# Patient Record
Sex: Female | Born: 2000 | Race: Asian | Hispanic: No | Marital: Single | Smoking: Never smoker
Health system: Southern US, Community
[De-identification: ages and names within clinical notes are randomized; demographics above are authoritative.]

## PROBLEM LIST (undated history)

## (undated) ENCOUNTER — Inpatient Hospital Stay (HOSPITAL_COMMUNITY): Payer: Self-pay

## (undated) DIAGNOSIS — R12 Heartburn: Secondary | ICD-10-CM

## (undated) DIAGNOSIS — N926 Irregular menstruation, unspecified: Secondary | ICD-10-CM

## (undated) DIAGNOSIS — E538 Deficiency of other specified B group vitamins: Secondary | ICD-10-CM

## (undated) DIAGNOSIS — N939 Abnormal uterine and vaginal bleeding, unspecified: Secondary | ICD-10-CM

## (undated) DIAGNOSIS — M549 Dorsalgia, unspecified: Secondary | ICD-10-CM

## (undated) DIAGNOSIS — F902 Attention-deficit hyperactivity disorder, combined type: Secondary | ICD-10-CM

## (undated) DIAGNOSIS — F909 Attention-deficit hyperactivity disorder, unspecified type: Secondary | ICD-10-CM

## (undated) DIAGNOSIS — F419 Anxiety disorder, unspecified: Secondary | ICD-10-CM

## (undated) DIAGNOSIS — E559 Vitamin D deficiency, unspecified: Secondary | ICD-10-CM

## (undated) DIAGNOSIS — Z8711 Personal history of peptic ulcer disease: Secondary | ICD-10-CM

## (undated) DIAGNOSIS — K5900 Constipation, unspecified: Secondary | ICD-10-CM

## (undated) DIAGNOSIS — F32A Depression, unspecified: Secondary | ICD-10-CM

## (undated) DIAGNOSIS — R278 Other lack of coordination: Secondary | ICD-10-CM

## (undated) DIAGNOSIS — R0602 Shortness of breath: Secondary | ICD-10-CM

## (undated) HISTORY — DX: Attention-deficit hyperactivity disorder, combined type: F90.2

## (undated) HISTORY — DX: Irregular menstruation, unspecified: N92.6

## (undated) HISTORY — DX: Other lack of coordination: R27.8

## (undated) HISTORY — DX: Attention-deficit hyperactivity disorder, unspecified type: F90.9

## (undated) HISTORY — DX: Depression, unspecified: F32.A

## (undated) HISTORY — DX: Constipation, unspecified: K59.00

## (undated) HISTORY — DX: Vitamin D deficiency, unspecified: E55.9

## (undated) HISTORY — DX: Dorsalgia, unspecified: M54.9

## (undated) HISTORY — DX: Deficiency of other specified B group vitamins: E53.8

## (undated) HISTORY — DX: Shortness of breath: R06.02

## (undated) HISTORY — DX: Heartburn: R12

## (undated) HISTORY — DX: Abnormal uterine and vaginal bleeding, unspecified: N93.9

## (undated) HISTORY — DX: Anxiety disorder, unspecified: F41.9

## (undated) HISTORY — DX: Personal history of peptic ulcer disease: Z87.11

---
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
## 2019-01-10 DIAGNOSIS — E611 Iron deficiency: Secondary | ICD-10-CM | POA: Diagnosis not present

## 2019-01-10 DIAGNOSIS — R635 Abnormal weight gain: Secondary | ICD-10-CM | POA: Diagnosis not present

## 2019-01-10 DIAGNOSIS — N951 Menopausal and female climacteric states: Secondary | ICD-10-CM | POA: Diagnosis not present

## 2019-01-10 DIAGNOSIS — E782 Mixed hyperlipidemia: Secondary | ICD-10-CM | POA: Diagnosis not present

## 2019-01-10 DIAGNOSIS — R7309 Other abnormal glucose: Secondary | ICD-10-CM | POA: Diagnosis not present

## 2019-01-10 DIAGNOSIS — E559 Vitamin D deficiency, unspecified: Secondary | ICD-10-CM | POA: Diagnosis not present

## 2019-01-12 DIAGNOSIS — Z1339 Encounter for screening examination for other mental health and behavioral disorders: Secondary | ICD-10-CM | POA: Diagnosis not present

## 2019-01-12 DIAGNOSIS — Z6841 Body Mass Index (BMI) 40.0 and over, adult: Secondary | ICD-10-CM | POA: Diagnosis not present

## 2019-01-12 DIAGNOSIS — Z62810 Personal history of physical and sexual abuse in childhood: Secondary | ICD-10-CM | POA: Diagnosis not present

## 2019-01-12 DIAGNOSIS — Z1331 Encounter for screening for depression: Secondary | ICD-10-CM | POA: Diagnosis not present

## 2019-01-12 DIAGNOSIS — E782 Mixed hyperlipidemia: Secondary | ICD-10-CM | POA: Diagnosis not present

## 2019-01-12 DIAGNOSIS — E611 Iron deficiency: Secondary | ICD-10-CM | POA: Diagnosis not present

## 2019-01-28 DIAGNOSIS — A09 Infectious gastroenteritis and colitis, unspecified: Secondary | ICD-10-CM | POA: Diagnosis not present

## 2019-02-05 ENCOUNTER — Encounter (HOSPITAL_COMMUNITY): Payer: Self-pay

## 2019-02-05 ENCOUNTER — Other Ambulatory Visit: Payer: Self-pay

## 2019-02-05 ENCOUNTER — Emergency Department (HOSPITAL_COMMUNITY)
Admission: EM | Admit: 2019-02-05 | Discharge: 2019-02-05 | Disposition: A | Discharge status: Home / Self Care | Payer: BLUE CROSS/BLUE SHIELD | Attending: Emergency Medicine | Admitting: Emergency Medicine

## 2019-02-05 DIAGNOSIS — R112 Nausea with vomiting, unspecified: Secondary | ICD-10-CM | POA: Diagnosis not present

## 2019-02-05 DIAGNOSIS — R1084 Generalized abdominal pain: Secondary | ICD-10-CM | POA: Insufficient documentation

## 2019-02-05 DIAGNOSIS — R197 Diarrhea, unspecified: Secondary | ICD-10-CM | POA: Insufficient documentation

## 2019-02-05 DIAGNOSIS — Z79899 Other long term (current) drug therapy: Secondary | ICD-10-CM | POA: Insufficient documentation

## 2019-02-05 LAB — URINALYSIS, ROUTINE W REFLEX MICROSCOPIC
Bilirubin Urine: NEGATIVE
Glucose, UA: NEGATIVE mg/dL
Ketones, ur: NEGATIVE mg/dL
Nitrite: NEGATIVE
Protein, ur: NEGATIVE mg/dL
Specific Gravity, Urine: 1.029 (ref 1.005–1.030)
pH: 5 (ref 5.0–8.0)

## 2019-02-05 LAB — CBC
HCT: 42.7 % (ref 36.0–46.0)
Hemoglobin: 13.1 g/dL (ref 12.0–15.0)
MCH: 26.5 pg (ref 26.0–34.0)
MCHC: 30.7 g/dL (ref 30.0–36.0)
MCV: 86.4 fL (ref 80.0–100.0)
Platelets: 435 10*3/uL — ABNORMAL HIGH (ref 150–400)
RBC: 4.94 MIL/uL (ref 3.87–5.11)
RDW: 14.3 % (ref 11.5–15.5)
WBC: 13.8 10*3/uL — ABNORMAL HIGH (ref 4.0–10.5)
nRBC: 0 % (ref 0.0–0.2)

## 2019-02-05 LAB — COMPREHENSIVE METABOLIC PANEL
ALT: 15 U/L (ref 0–44)
AST: 15 U/L (ref 15–41)
Albumin: 3.5 g/dL (ref 3.5–5.0)
Alkaline Phosphatase: 114 U/L (ref 38–126)
Anion gap: 7 (ref 5–15)
BUN: 13 mg/dL (ref 6–20)
CO2: 23 mmol/L (ref 22–32)
Calcium: 8.6 mg/dL — ABNORMAL LOW (ref 8.9–10.3)
Chloride: 106 mmol/L (ref 98–111)
Creatinine, Ser: 0.66 mg/dL (ref 0.44–1.00)
GFR calc Af Amer: >60 mL/min (ref >60)
GFR calc non Af Amer: >60 mL/min (ref >60)
Glucose, Bld: 119 mg/dL — ABNORMAL HIGH (ref 70–99)
Potassium: 3.6 mmol/L (ref 3.5–5.1)
Sodium: 136 mmol/L (ref 135–145)
Total Bilirubin: 0.4 mg/dL (ref 0.3–1.2)
Total Protein: 7.3 g/dL (ref 6.5–8.1)

## 2019-02-05 LAB — LIPASE, BLOOD: Lipase: 20 U/L (ref 11–51)

## 2019-02-05 LAB — I-STAT BETA HCG BLOOD, ED (MC, WL, AP ONLY): I-stat hCG, quantitative: <5 m[IU]/mL (ref <5)

## 2019-02-05 MED ORDER — ONDANSETRON 4 MG PO TBDP: 4.0000 mg | ORAL_TABLET | Freq: Three times a day (TID) | ORAL | 0 refills | Status: DC | PRN

## 2019-02-05 NOTE — ED Provider Notes (Signed)
[facility] DEPT Provider Note   CSN: [id number] Arrival date & time: 02/05/19  0350     History   Chief Complaint Chief Complaint  Patient presents with  . Abdominal Pain    HPI Jean Stone is a 18 y.o. female without significant past medical history, presenting to the emergency department with complaint of nausea, vomiting, and diarrhea began Friday morning upon awakening.  She states she also had some generalized lower abdominal cramping, however this has mostly resolved.  She states her nausea, vomiting, and diarrhea are also improving.  She denies nausea on evaluation.  She denies fever or urinary symptoms.  No pelvic complaints.  Medications taken for symptoms.  No known sick contacts.     The history is provided by the patient.    Past Medical History:  Diagnosis Date  . Abnormal bleeding in menstrual cycle   . ADHD (attention deficit hyperactivity disorder)   . 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 Pain in right wrist 02/05/2018  . 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 No pertinent surgical history.   OB History   No obstetric history on file.      Home Medications    Prior to Admission medications   Medication Sig Start Date End Date Taking? Authorizing Provider  acetaminophen (TYLENOL) 500 MG tablet Take 1,000 mg by mouth every 6 (six) hours as needed for moderate pain.   Yes [provider]  levonorgestrel-ethinyl estradiol (SEASONALE) 0.15-0.03 MG tablet Take 1 tablet by mouth daily.   Yes [provider]  ibuprofen (ADVIL,MOTRIN) 800 MG tablet Take 1 tablet (800 mg total) by mouth every 8 (eight) hours as needed for moderate pain. Patient not  taking: Reported on 02/05/2019 02/04/18   Ezequiel Essex, MD  ondansetron (ZOFRAN ODT) 4 MG disintegrating tablet Take 1 tablet (4 mg total) by mouth every 8 (eight) hours as needed for nausea or vomiting. 02/05/19   Robinson, [place] N, PA-C    Family History Family History  Problem Relation Age of Onset  . ADD / ADHD Sister   . Diabetes Maternal Grandmother   . Hypertension Maternal Grandfather     Social History Social History   Tobacco Use  . Smoking status: Never Smoker  . Smokeless tobacco: Never Used  Substance Use Topics  . Alcohol use: No    Alcohol/week: 0.0 standard drinks  . Drug use: No     Allergies   Patient has no known allergies.   Review of Systems Review of Systems  Gastrointestinal: Positive for diarrhea, nausea and vomiting.  All other systems reviewed and are negative.    Physical Exam Updated Vital Signs BP (!) 141/94   Pulse 97   Temp 98.8 F (37.1 C) (Oral)   Resp 16   SpO2 97%   Physical Exam Vitals signs and nursing note reviewed.  Constitutional:      General: She is not in acute distress.    Appearance: She is well-developed. She is obese. She is not ill-appearing.  HENT:     Head: Normocephalic and atraumatic.     Mouth/Throat:     Mouth: Mucous membranes are moist.  Eyes:     Conjunctiva/sclera: Conjunctivae normal.  Cardiovascular:     Rate and Rhythm: Normal rate  and regular rhythm.  Pulmonary:     Effort: Pulmonary effort is normal. No respiratory distress.     Breath sounds: Normal breath sounds.  Abdominal:     General: There is no distension.     Palpations: Abdomen is soft.     Tenderness: There is no abdominal tenderness. There is no guarding or rebound. Negative signs include Rovsing's sign and McBurney's sign.  Skin:    General: Skin is warm.  Neurological:     Mental Status: She is alert.  Psychiatric:        Behavior: Behavior normal.      ED Treatments / Results  Labs (all labs ordered are listed, but  only abnormal results are displayed) Labs Reviewed  COMPREHENSIVE METABOLIC PANEL - Abnormal; Notable for the following components:      Result Value   Glucose, Bld 119 (*)    Calcium 8.6 (*)    All other components within normal limits  CBC - Abnormal; Notable for the following components:   WBC 13.8 (*)    Platelets 435 (*)    All other components within normal limits  URINALYSIS, ROUTINE W REFLEX MICROSCOPIC - Abnormal; Notable for the following components:   APPearance HAZY (*)    Hgb urine dipstick SMALL (*)    Leukocytes,Ua TRACE (*)    Bacteria, UA RARE (*)    All other components within normal limits  URINE CULTURE  LIPASE, BLOOD  I-STAT BETA HCG BLOOD, ED ([facility], [facility], [facility] ONLY)    EKG None  Radiology No results found.  Procedures Procedures (including critical care time)  Medications Ordered in ED Medications - No data to display   Initial Impression / Assessment and Plan / ED Course  I have reviewed the triage vital signs and the nursing notes.  Pertinent labs & imaging results that were available during my care of the patient were reviewed by me and considered in my medical decision making (see chart for details).       Patient with symptoms consistent with likely viral gastroenteritis.  Vitals are stable, no fever.  No signs of dehydration, tolerating PO fluids > 6 oz.  Lungs are clear.  No focal abdominal pain, no concern for appendicitis, cholecystitis, pancreatitis, ruptured viscus, UTI, kidney stone, or any other abdominal etiology. Labs obtained in triage reveal leukocytosis, likely reactive from the vomiting. U/A with mild signs of infection however pt is asymptomatic, will send for culture.  Supportive therapy indicated with return if symptoms worsen.  Patient agreeable to plan and safe for discharge.  Discussed results, findings, treatment and follow up. Patient advised of return precautions. Patient verbalized understanding and agreed with plan.   Final  Clinical Impressions(s) / ED Diagnoses   Final diagnoses:  Nausea vomiting and diarrhea    ED Discharge Orders         Ordered    ondansetron (ZOFRAN ODT) 4 MG disintegrating tablet  Every 8 hours PRN     02/05/19 0530           Robinson, [place] N, PA-C 02/05/19 0534    Paula LibraMolpus, John, MD 02/05/19 [phone]

## 2019-02-05 NOTE — Discharge Instructions (Addendum)
Please read instructions below. °Drink clear liquids until your stomach feels better. Then, slowly introduce bland foods into your diet as tolerated, such as bread, rice, apples, bananas. °You can take zofran every 8 hours as needed for nausea. °Follow up with your primary care if symptoms persist. °Return to the ER for severe abdominal pain, fever, uncontrollable vomiting, or new or concerning symptoms. ° °

## 2019-02-05 NOTE — ED Notes (Signed)
Urine culture sent to the lab. 

## 2019-02-05 NOTE — ED Triage Notes (Signed)
Pt reports lower abdominal pain that started yesterday. She endorses nausea, vomiting, and diarrhea all day yesterday.

## 2019-02-07 LAB — URINE CULTURE: Culture: >=100000 — AB

## 2019-02-08 DIAGNOSIS — Z6841 Body Mass Index (BMI) 40.0 and over, adult: Secondary | ICD-10-CM | POA: Diagnosis not present

## 2019-02-08 DIAGNOSIS — E782 Mixed hyperlipidemia: Secondary | ICD-10-CM | POA: Diagnosis not present

## 2019-03-22 ENCOUNTER — Ambulatory Visit (INDEPENDENT_AMBULATORY_CARE_PROVIDER_SITE_OTHER): Payer: BC Managed Care – PPO | Admitting: Plastic Surgery

## 2019-03-22 ENCOUNTER — Encounter: Payer: Self-pay | Admitting: Plastic Surgery

## 2019-03-22 ENCOUNTER — Other Ambulatory Visit: Payer: Self-pay

## 2019-03-22 DIAGNOSIS — G8929 Other chronic pain: Secondary | ICD-10-CM | POA: Diagnosis not present

## 2019-03-22 DIAGNOSIS — M549 Dorsalgia, unspecified: Secondary | ICD-10-CM | POA: Insufficient documentation

## 2019-03-22 DIAGNOSIS — M542 Cervicalgia: Secondary | ICD-10-CM

## 2019-03-22 DIAGNOSIS — N62 Hypertrophy of breast: Secondary | ICD-10-CM | POA: Diagnosis not present

## 2019-03-22 DIAGNOSIS — M546 Pain in thoracic spine: Secondary | ICD-10-CM | POA: Diagnosis not present

## 2019-03-22 NOTE — Progress Notes (Signed)
Patient ID: Jean Stone, female    DOB: [birth year], 18 y.o.   MRN: [id number]   Chief Complaint  Patient presents with  . Advice Only    for (B) breast reduction    Mammary Hyperplasia: The patient is a 18 y.o. female with a history of mammary hyperplasia for several years.  She has extremely large breasts causing symptoms that include the following: Back pain (upper and lower) and neck pain. She frequently pins bra cups higher on straps for better lift and relief. Notices relief when holding breast up in her hands. Shoulder straps causing grooves, pain occasionally requiring padding. Pain medication is sometimes required with motrin and tylenol.  Activities that are hindered by enlarged breasts include: Running and exercise  Her breasts are extremely large and fairly symmetric.  She has hyperpigmentation of the inframammary area on both sides.  The sternal to nipple distance on the right is 35 cm and the left is 38 cm.  The IMF distance is 6 cm.  She is 5 feet 5 in inches tall and weighs 254 pounds.  Preoperative bra size = 40 cup.  The estimated excess breast tissue to be removed at the time of surgery = 900 grams on the left and 900 grams on the right.  Mammogram history: No history of a mammogram and no family history of breast cancer.   Review of Systems  Constitutional: Positive for activity change. Negative for appetite change.  HENT: Negative.   Eyes: Negative.   Respiratory: Negative.   Cardiovascular: Negative.  Negative for leg swelling.  Gastrointestinal: Negative for abdominal pain.  Endocrine: Negative.   Genitourinary: Negative.   Musculoskeletal: Positive for back pain and neck pain.  Skin: Positive for color change.  Hematological: Negative.   Psychiatric/Behavioral: Negative.     Past Medical History:  Diagnosis Date  . Abnormal bleeding in menstrual cycle   . ADHD (attention deficit hyperactivity disorder)   . ADHD (attention deficit hyperactivity disorder),  combined type 10/02/2015  . 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    Current Outpatient Medications:  .  levonorgestrel-ethinyl estradiol (SEASONALE) 0.15-0.03 MG tablet, Take 1 tablet by mouth daily., Disp: , Rfl:    Objective:   Vitals:   03/22/19 1526  BP: 107/76  Pulse: 96  Temp: 97.8 F (36.6 C)  SpO2: 98%    Physical Exam Constitutional:      Appearance: Normal appearance.  HENT:     Head: Normocephalic and atraumatic.  Eyes:     Extraocular Movements: Extraocular movements intact.  Neck:     Musculoskeletal: Normal range of motion.  Cardiovascular:     Rate and Rhythm: Normal rate.     Pulses: Normal pulses.  Pulmonary:     Effort: Pulmonary effort is normal.  Abdominal:     General: Abdomen is flat. There is no distension.     Tenderness: There is no abdominal tenderness.     Hernia: No hernia is present.  Musculoskeletal: Normal range of motion.  Skin:    General: Skin is warm.  Neurological:     General: No focal deficit present.     Mental Status: She is alert and oriented to person, place, and time.  Psychiatric:        Mood and Affect: Mood normal.        Behavior: Behavior normal.        Thought Content: Thought content normal.     Assessment & Plan:  Symptomatic mammary hypertrophy  Chronic bilateral thoracic back pain  Neck pain  Recommend bilateral breast reduction with liposuction. Patient will get PT and PCP information sent to us.  Pictures were obtained of the patient and placed in the chart with the patient's or guardian's permission.  Alena Billslaire S Dillingham, DO

## 2019-08-17 DIAGNOSIS — Z01419 Encounter for gynecological examination (general) (routine) without abnormal findings: Secondary | ICD-10-CM | POA: Diagnosis not present

## 2019-08-17 DIAGNOSIS — Z6841 Body Mass Index (BMI) 40.0 and over, adult: Secondary | ICD-10-CM | POA: Diagnosis not present

## 2019-08-18 DIAGNOSIS — R109 Unspecified abdominal pain: Secondary | ICD-10-CM | POA: Diagnosis not present

## 2019-08-18 DIAGNOSIS — R635 Abnormal weight gain: Secondary | ICD-10-CM | POA: Diagnosis not present

## 2019-08-18 DIAGNOSIS — R002 Palpitations: Secondary | ICD-10-CM | POA: Diagnosis not present

## 2019-08-19 ENCOUNTER — Other Ambulatory Visit: Payer: Self-pay | Admitting: Pediatrics

## 2019-08-19 ENCOUNTER — Other Ambulatory Visit (HOSPITAL_COMMUNITY): Payer: Self-pay | Admitting: Pediatrics

## 2019-08-19 DIAGNOSIS — R109 Unspecified abdominal pain: Secondary | ICD-10-CM

## 2019-08-24 ENCOUNTER — Ambulatory Visit (HOSPITAL_COMMUNITY): Payer: BC Managed Care – PPO

## 2019-08-24 DIAGNOSIS — R002 Palpitations: Secondary | ICD-10-CM | POA: Diagnosis not present

## 2019-08-24 DIAGNOSIS — R079 Chest pain, unspecified: Secondary | ICD-10-CM | POA: Diagnosis not present

## 2019-08-25 ENCOUNTER — Ambulatory Visit (HOSPITAL_COMMUNITY): Payer: BC Managed Care – PPO

## 2019-08-31 ENCOUNTER — Other Ambulatory Visit: Payer: Self-pay

## 2019-08-31 ENCOUNTER — Encounter (HOSPITAL_COMMUNITY): Payer: Self-pay

## 2019-08-31 ENCOUNTER — Ambulatory Visit (HOSPITAL_COMMUNITY)
Admission: RE | Admit: 2019-08-31 | Discharge: 2019-08-31 | Disposition: A | Discharge status: Home / Self Care | Payer: BC Managed Care – PPO | Source: Ambulatory Visit | Attending: Pediatrics | Admitting: Pediatrics

## 2019-08-31 DIAGNOSIS — R109 Unspecified abdominal pain: Secondary | ICD-10-CM
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
## 2019-09-05 DIAGNOSIS — L0889 Other specified local infections of the skin and subcutaneous tissue: Secondary | ICD-10-CM | POA: Diagnosis not present

## 2019-09-05 DIAGNOSIS — L600 Ingrowing nail: Secondary | ICD-10-CM | POA: Diagnosis not present

## 2019-09-05 DIAGNOSIS — R109 Unspecified abdominal pain: Secondary | ICD-10-CM | POA: Diagnosis not present

## 2019-09-05 DIAGNOSIS — K219 Gastro-esophageal reflux disease without esophagitis: Secondary | ICD-10-CM | POA: Diagnosis not present

## 2019-09-09 DIAGNOSIS — R002 Palpitations: Secondary | ICD-10-CM | POA: Diagnosis not present

## 2019-09-29 DIAGNOSIS — N6459 Other signs and symptoms in breast: Secondary | ICD-10-CM | POA: Diagnosis not present

## 2019-11-10 DIAGNOSIS — R112 Nausea with vomiting, unspecified: Secondary | ICD-10-CM | POA: Diagnosis not present

## 2019-11-10 DIAGNOSIS — R1013 Epigastric pain: Secondary | ICD-10-CM | POA: Diagnosis not present

## 2019-11-10 DIAGNOSIS — R195 Other fecal abnormalities: Secondary | ICD-10-CM | POA: Diagnosis not present

## 2019-11-10 DIAGNOSIS — R140 Abdominal distension (gaseous): Secondary | ICD-10-CM | POA: Diagnosis not present

## 2019-11-11 ENCOUNTER — Other Ambulatory Visit: Payer: Self-pay | Admitting: Gastroenterology

## 2019-11-11 ENCOUNTER — Other Ambulatory Visit: Payer: BC Managed Care – PPO

## 2019-11-11 ENCOUNTER — Other Ambulatory Visit: Payer: Self-pay

## 2019-11-11 DIAGNOSIS — R112 Nausea with vomiting, unspecified: Secondary | ICD-10-CM

## 2019-11-11 DIAGNOSIS — R899 Unspecified abnormal finding in specimens from other organs, systems and tissues: Secondary | ICD-10-CM

## 2019-11-11 DIAGNOSIS — R1013 Epigastric pain: Secondary | ICD-10-CM | POA: Diagnosis not present

## 2019-11-11 DIAGNOSIS — R195 Other fecal abnormalities: Secondary | ICD-10-CM | POA: Diagnosis not present

## 2019-11-17 DIAGNOSIS — R450 Nervousness: Secondary | ICD-10-CM | POA: Diagnosis not present

## 2019-11-17 DIAGNOSIS — F419 Anxiety disorder, unspecified: Secondary | ICD-10-CM | POA: Diagnosis not present

## 2019-11-17 DIAGNOSIS — R4582 Worries: Secondary | ICD-10-CM | POA: Diagnosis not present

## 2019-11-18 ENCOUNTER — Ambulatory Visit
Admission: RE | Admit: 2019-11-18 | Discharge: 2019-11-18 | Disposition: A | Discharge status: Home / Self Care | Payer: BC Managed Care – PPO | Source: Ambulatory Visit | Attending: Gastroenterology | Admitting: Gastroenterology

## 2019-11-18 DIAGNOSIS — R899 Unspecified abnormal finding in specimens from other organs, systems and tissues: Secondary | ICD-10-CM

## 2019-11-18 DIAGNOSIS — R112 Nausea with vomiting, unspecified: Secondary | ICD-10-CM

## 2019-11-18 DIAGNOSIS — R197 Diarrhea, unspecified: Secondary | ICD-10-CM | POA: Diagnosis not present

## 2019-11-18 DIAGNOSIS — R1110 Vomiting, unspecified: Secondary | ICD-10-CM | POA: Diagnosis not present

## 2019-11-18 DIAGNOSIS — R109 Unspecified abdominal pain: Secondary | ICD-10-CM | POA: Diagnosis not present

## 2019-11-18 MED ORDER — IOPAMIDOL (ISOVUE-300) INJECTION 61%
100.0000 mL | Freq: Once | INTRAVENOUS | Status: AC | PRN
  Administered 2019-11-18: 100 mL via INTRAVENOUS

## 2020-04-02 DIAGNOSIS — S8991XA Unspecified injury of right lower leg, initial encounter: Secondary | ICD-10-CM | POA: Diagnosis not present

## 2020-04-02 DIAGNOSIS — L0889 Other specified local infections of the skin and subcutaneous tissue: Secondary | ICD-10-CM | POA: Diagnosis not present

## 2020-06-02 DIAGNOSIS — H5212 Myopia, left eye: Secondary | ICD-10-CM | POA: Diagnosis not present

## 2020-07-17 DIAGNOSIS — Z1152 Encounter for screening for COVID-19: Secondary | ICD-10-CM | POA: Diagnosis not present

## 2020-08-17 DIAGNOSIS — Z6841 Body Mass Index (BMI) 40.0 and over, adult: Secondary | ICD-10-CM | POA: Diagnosis not present

## 2020-08-17 DIAGNOSIS — Z01419 Encounter for gynecological examination (general) (routine) without abnormal findings: Secondary | ICD-10-CM | POA: Diagnosis not present

## 2020-08-17 DIAGNOSIS — Z113 Encounter for screening for infections with a predominantly sexual mode of transmission: Secondary | ICD-10-CM | POA: Diagnosis not present

## 2020-09-23 DIAGNOSIS — Z23 Encounter for immunization: Secondary | ICD-10-CM | POA: Diagnosis not present

## 2020-09-23 DIAGNOSIS — W228XXA Striking against or struck by other objects, initial encounter: Secondary | ICD-10-CM | POA: Diagnosis not present

## 2020-09-23 DIAGNOSIS — S99922A Unspecified injury of left foot, initial encounter: Secondary | ICD-10-CM | POA: Diagnosis not present

## 2020-09-24 DIAGNOSIS — M79675 Pain in left toe(s): Secondary | ICD-10-CM | POA: Diagnosis not present

## 2020-10-02 DIAGNOSIS — S92415A Nondisplaced fracture of proximal phalanx of left great toe, initial encounter for closed fracture: Secondary | ICD-10-CM | POA: Diagnosis not present

## 2020-10-02 DIAGNOSIS — L600 Ingrowing nail: Secondary | ICD-10-CM | POA: Diagnosis not present

## 2020-10-09 DIAGNOSIS — S92415D Nondisplaced fracture of proximal phalanx of left great toe, subsequent encounter for fracture with routine healing: Secondary | ICD-10-CM | POA: Diagnosis not present

## 2020-10-17 DIAGNOSIS — S92415D Nondisplaced fracture of proximal phalanx of left great toe, subsequent encounter for fracture with routine healing: Secondary | ICD-10-CM | POA: Diagnosis not present

## 2021-02-23 DIAGNOSIS — F603 Borderline personality disorder: Secondary | ICD-10-CM | POA: Diagnosis not present

## 2021-02-23 DIAGNOSIS — F908 Attention-deficit hyperactivity disorder, other type: Secondary | ICD-10-CM | POA: Diagnosis not present

## 2021-02-23 DIAGNOSIS — F3160 Bipolar disorder, current episode mixed, unspecified: Secondary | ICD-10-CM | POA: Diagnosis not present

## 2021-02-26 ENCOUNTER — Emergency Department (HOSPITAL_BASED_OUTPATIENT_CLINIC_OR_DEPARTMENT_OTHER)
Admission: EM | Admit: 2021-02-26 | Discharge: 2021-02-26 | Disposition: A | Discharge status: Home / Self Care | Payer: BC Managed Care – PPO | Attending: Emergency Medicine | Admitting: Emergency Medicine

## 2021-02-26 ENCOUNTER — Encounter (HOSPITAL_BASED_OUTPATIENT_CLINIC_OR_DEPARTMENT_OTHER): Payer: Self-pay

## 2021-02-26 ENCOUNTER — Other Ambulatory Visit: Payer: Self-pay

## 2021-02-26 DIAGNOSIS — X58XXXA Exposure to other specified factors, initial encounter: Secondary | ICD-10-CM | POA: Insufficient documentation

## 2021-02-26 DIAGNOSIS — S0502XA Injury of conjunctiva and corneal abrasion without foreign body, left eye, initial encounter: Secondary | ICD-10-CM | POA: Insufficient documentation

## 2021-02-26 DIAGNOSIS — S0592XA Unspecified injury of left eye and orbit, initial encounter: Secondary | ICD-10-CM | POA: Diagnosis not present

## 2021-02-26 MED ORDER — CIPROFLOXACIN HCL 0.3 % OP SOLN
2.0000 [drp] | OPHTHALMIC | 0 refills | Status: DC
Start: 2021-02-26 — End: 2021-11-21

## 2021-02-26 MED ORDER — TETRACAINE HCL 0.5 % OP SOLN
2.0000 [drp] | Freq: Once | OPHTHALMIC | Status: AC
  Administered 2021-02-26: 2 [drp] via OPHTHALMIC
  Filled 2021-02-26: qty 4

## 2021-02-26 MED ORDER — FLUORESCEIN SODIUM 1 MG OP STRP
1.0000 | ORAL_STRIP | Freq: Once | OPHTHALMIC | Status: AC
  Administered 2021-02-26: 1 via OPHTHALMIC
  Filled 2021-02-26: qty 1

## 2021-02-26 NOTE — ED Triage Notes (Signed)
Patient arrives from home, reporting "it feels like something is scraping my eye."  Patient left is red and hard to open.

## 2021-02-26 NOTE — ED Provider Notes (Signed)
[facility] EMERGENCY DEPT Provider Note   CSN: [id number] Arrival date & time: 02/26/21  2242     History Chief Complaint  Patient presents with   Eye Problem    Jean Stone is a 20 y.o. female.  The history is provided by the patient.  Eye Problem Location:  Left eye Quality:  Aching and foreign body sensation Severity:  Moderate Onset quality:  Gradual Duration:  1 day Timing:  Constant Progression:  Unchanged Chronicity:  New Context: not burn   Relieved by:  Nothing Worsened by:  Nothing Ineffective treatments:  None tried Associated symptoms: tearing   Associated symptoms: no blurred vision, no decreased vision, no discharge, no double vision, no inflammation, no numbness, no photophobia, no tingling and no weakness   Risk factors: no conjunctival hemorrhage       Past Medical History:  Diagnosis Date   Abnormal bleeding in menstrual cycle    ADHD (attention deficit hyperactivity disorder)    ADHD (attention deficit hyperactivity disorder), 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 No pertinent surgical history.   OB History   No obstetric history on file.     Family History  Problem Relation Age of Onset   ADD / ADHD Sister    Diabetes Maternal Grandmother    Hypertension Maternal Grandfather     Social History   Tobacco Use   Smoking status: Never   Smokeless tobacco: Never  Substance Use Topics   Alcohol use: No    Alcohol/week: 0.0 standard drinks   Drug use: No    Home Medications Prior to Admission medications   Medication Sig Start Date End Date  Taking? Authorizing Provider  levonorgestrel-ethinyl estradiol (SEASONALE) 0.15-0.03 MG tablet Take 1 tablet by mouth daily.    [provider]    Allergies    Patient has no known allergies.  Review of Systems   Review of Systems  Eyes:  Negative for blurred vision, double vision, photophobia and discharge.  Neurological:  Negative for tingling, weakness and numbness.   Physical Exam Updated Vital Signs BP (!) 147/92 (BP Location: Right Arm)   Pulse 95   Temp 98.9 F (37.2 C) (Oral)   Resp 18   Ht 5\' 5"  (1.651 m)   Wt 117.9 kg   SpO2 98%   BMI 43.27 kg/m   Physical Exam Vitals and nursing note reviewed.  Constitutional:      General: She is not in acute distress.    Appearance: Normal appearance.  HENT:     Head: Normocephalic and atraumatic.     Nose: Nose normal.  Eyes:     Conjunctiva/sclera: Conjunctivae normal.     Pupils: Pupils are equal, round, and reactive to light.      Comments: Corneal abrasion 2 oclock   Cardiovascular:     Rate and Rhythm: Normal rate and regular rhythm.     Pulses: Normal pulses.     Heart sounds: Normal heart sounds.  Pulmonary:     Effort: Pulmonary effort is normal.     Breath sounds: Normal breath sounds.  Abdominal:     General: Abdomen is  flat. Bowel sounds are normal.     Palpations: Abdomen is soft.     Tenderness: There is no abdominal tenderness. There is no guarding.  Musculoskeletal:        General: Normal range of motion.     Cervical back: Normal range of motion and neck supple.  Skin:    General: Skin is warm and dry.     Capillary Refill: Capillary refill takes less than 2 seconds.  Neurological:     General: No focal deficit present.     Mental Status: She is alert and oriented to person, place, and time.     Deep Tendon Reflexes: Reflexes normal.  Psychiatric:        Mood and Affect: Mood normal.        Behavior: Behavior normal.    ED Results / Procedures / Treatments   Labs (all labs  ordered are listed, but only abnormal results are displayed) Labs Reviewed - No data to display  EKG None  Radiology No results found.  Procedures Procedures   Medications Ordered in ED Medications  tetracaine (PONTOCAINE) 0.5 % ophthalmic solution 2 drop (has no administration in time range)  fluorescein ophthalmic strip 1 strip (has no administration in time range)    ED Course  I have reviewed the triage vital signs and the nursing notes.  Pertinent labs & imaging results that were available during my care of the patient were reviewed by me and considered in my medical decision making (seeCorn chart for details).    Jean Stone was evaluated in Emergency Department on 02/26/2021 for the symptoms described in the history of present illness. She was evaluated in the context of the global COVID-19 pandemic, which necessitated consideration that the patient might be at risk for infection with the SARS-CoV-2 virus that causes COVID-19. Institutional protocols and algorithms that pertain to the evaluation of patients at risk for COVID-19 are in a state of rapid change based on information released by regulatory bodies including the CDC and federal and state organizations. These policies and algorithms were followed during the patient's care in the ED.  Final Clinical Impression(s) / ED Diagnoses     Return for intractable cough, coughing up blood, fevers > 100.4 unrelieved by medication, shortness of breath, intractable vomiting, chest pain, shortness of breath, weakness, numbness, changes in speech, facial asymmetry, abdominal pain, passing out, Inability to tolerate liquids or food, cough, altered mental status or any concerns. No signs of systemic illness or infection. The patient is nontoxic-appearing on exam and vital signs are within normal limits. I have reviewed the triage vital signs and the nursing notes. Pertinent labs & imaging results that were available during my care of the  patient were reviewed by me and considered in my medical decision making (see chart for details). After history, exam, and medical workup I feel the patient has been appropriately medically screened and is safe for discharge home. Pertinent diagnoses were discussed with the patient. Patient was given return precautions.    Kamerin Axford, MD 02/26/21 [phone]

## 2021-03-09 DIAGNOSIS — F603 Borderline personality disorder: Secondary | ICD-10-CM | POA: Diagnosis not present

## 2021-03-09 DIAGNOSIS — F908 Attention-deficit hyperactivity disorder, other type: Secondary | ICD-10-CM | POA: Diagnosis not present

## 2021-03-09 DIAGNOSIS — F3160 Bipolar disorder, current episode mixed, unspecified: Secondary | ICD-10-CM | POA: Diagnosis not present

## 2021-03-14 DIAGNOSIS — N644 Mastodynia: Secondary | ICD-10-CM | POA: Diagnosis not present

## 2021-04-20 DIAGNOSIS — F908 Attention-deficit hyperactivity disorder, other type: Secondary | ICD-10-CM | POA: Diagnosis not present

## 2021-04-20 DIAGNOSIS — F603 Borderline personality disorder: Secondary | ICD-10-CM | POA: Diagnosis not present

## 2021-04-20 DIAGNOSIS — F3160 Bipolar disorder, current episode mixed, unspecified: Secondary | ICD-10-CM | POA: Diagnosis not present

## 2021-05-18 DIAGNOSIS — F908 Attention-deficit hyperactivity disorder, other type: Secondary | ICD-10-CM | POA: Diagnosis not present

## 2021-05-18 DIAGNOSIS — F3160 Bipolar disorder, current episode mixed, unspecified: Secondary | ICD-10-CM | POA: Diagnosis not present

## 2021-05-18 DIAGNOSIS — F603 Borderline personality disorder: Secondary | ICD-10-CM | POA: Diagnosis not present

## 2021-06-03 DIAGNOSIS — N649 Disorder of breast, unspecified: Secondary | ICD-10-CM | POA: Diagnosis not present

## 2021-06-03 DIAGNOSIS — N6452 Nipple discharge: Secondary | ICD-10-CM | POA: Diagnosis not present

## 2021-06-03 DIAGNOSIS — N63 Unspecified lump in unspecified breast: Secondary | ICD-10-CM | POA: Diagnosis not present

## 2021-06-10 ENCOUNTER — Ambulatory Visit (INDEPENDENT_AMBULATORY_CARE_PROVIDER_SITE_OTHER): Payer: BC Managed Care – PPO | Admitting: Family Medicine

## 2021-06-10 ENCOUNTER — Ambulatory Visit: Payer: BC Managed Care – PPO | Admitting: Family

## 2021-06-10 ENCOUNTER — Encounter: Payer: Self-pay | Admitting: Family Medicine

## 2021-06-10 VITALS — BP 112/90 | HR 65 | Temp 99.0°F | Wt 262.4 lb

## 2021-06-10 DIAGNOSIS — F419 Anxiety disorder, unspecified: Secondary | ICD-10-CM

## 2021-06-10 DIAGNOSIS — F902 Attention-deficit hyperactivity disorder, combined type: Secondary | ICD-10-CM | POA: Diagnosis not present

## 2021-06-10 DIAGNOSIS — F321 Major depressive disorder, single episode, moderate: Secondary | ICD-10-CM

## 2021-06-10 DIAGNOSIS — Z7689 Persons encountering health services in other specified circumstances: Secondary | ICD-10-CM

## 2021-06-10 NOTE — Patient Instructions (Signed)
You can schedule a physical in the next few weeks.  Check to see if you can find the name(s) of the previous providers you have seen/are seeing.

## 2021-06-10 NOTE — Progress Notes (Signed)
Patient presents to clinic today to establish care and follow-up on chronic conditions.  SUBJECTIVE: PMH: Pt is a 20 yo female with pmh sig for anxiety, ADHD, obesity who presents to establish care with a new provider and follow-up on ongoing concerns.  Patient is unsure who she states that in the past.  Patient denies medical problems.  Patient states she is taking Lamictal 100 mg for "anxiety".  Seen at [facility] psychological.  Patient states people and loud sounds makes her anxious.  Patient thinks she has dealt with anxiety all of her life in but in all 2019 it "got bad".  Patient thinks she was on another medication but cannot recall the name.  ADHD: -Combined type per chart review -Patient states she was diagnosed "when younger". -Currently on Vyvanse 20 mg daily -Patient states in the past higher doses of Vyvanse caused increased sadness and SI -Patient endorses increased difficulty with attention at new lower dose of Vyvanse  OCP use: -Patient states started OCPs in fifth grade due to bad cramps -States was gone a different OCP but does not recall the name. -Also encouraged to be on OCPs by [facility] as her "emotions are all over the place".    Obesity: -Patient endorses difficulty losing weight. -In the past tried exercise and eating less. -Currently may eat once per day. -Drinking 1 glass of water per day as does not like the taste. -States OCPs may have caused her weight gain. -Inquires about TSH. -Last TSH normal 08/18/2019 per chart review.  Allergies: NKDA  Social history: Pt is an [employer] school.  Patient denies tobacco use.  Endorses occasional alcohol and delta 8 use.   Health Maintenance: Dental --seen by dentist but cannot recall the provider's name. Vision -- Immunizations --influenza vaccine 03/22/2020, Tdap 11/29/2010, Pneumovax 03/30/2000  Family medical history: Mom-preplanned Dad-Hodgkin's lymphoma PGM-DM MGM-DM   Past Medical History:  Diagnosis  Date   Abnormal bleeding in menstrual cycle    ADHD (attention deficit hyperactivity disorder)    ADHD (attention deficit hyperactivity disorder), combined type 10/02/2015   Dysgraphia 10/02/2015    No past surgical history on file.  Current Outpatient Medications on File Prior to Visit  Medication Sig Dispense Refill   lamoTRIgine (LAMICTAL) 100 MG tablet Take 100 mg by mouth daily.     levonorgestrel-ethinyl estradiol (SEASONALE) 0.15-0.03 MG tablet Take 1 tablet by mouth daily.     VYVANSE 20 MG capsule Take 20 mg by mouth daily.     ciprofloxacin (CILOXAN) 0.3 % ophthalmic solution Place 2 drops into the left eye every 4 (four) hours while awake. Administer 1 drop, every 2 hours, while awake, for 2 days. Then 1 drop, every 4 hours, while awake, for the next 5 days. (Patient not taking: Reported on 06/10/2021) 5 mL 0   No current facility-administered medications on file prior to visit.    No Known Allergies  Family History  Problem Relation Age of Onset   ADD / ADHD Sister    Diabetes Maternal Grandmother    Hypertension Maternal Grandfather     Social History   Socioeconomic History   Marital status: Single    Spouse name: Not on file   Number of children: Not on file   Years of education: Not on file   Highest education level: Not on file  Occupational History   Not on file  Tobacco Use   Smoking status: Never   Smokeless tobacco: Never  Substance and Sexual Activity   Alcohol  use: No    Alcohol/week: 0.0 standard drinks   Drug use: No   Sexual activity: Never  Other Topics Concern   Not on file  Social History Narrative   Work or School: [employer] Situation: lives with mother, sister and brother      Spiritual Beliefs: Baha'i      Lifestyle: no regular exercise, does PE at school; diet is healthy         Social Determinants of Health   Financial Resource Strain: Not on file  Food Insecurity: Not on file  Transportation Needs: Not on file   Physical Activity: Not on file  Stress: Not on file  Social Connections: Not on file  Intimate Partner Violence: Not on file    ROS General: Denies fever, chills, night sweats, changes in weight, changes in appetite +weight gain/difficulty losing weight and inattention. HEENT: Denies headaches, ear pain, changes in vision, rhinorrhea, sore throat CV: Denies CP, palpitations, SOB, orthopnea Pulm: Denies SOB, cough, wheezing GI: Denies abdominal pain, nausea, vomiting, diarrhea, constipation GU: Denies dysuria, hematuria, frequency, vaginal discharge Msk: Denies muscle cramps, joint pains Neuro: Denies weakness, numbness, tingling Skin: Denies rashes, bruising Psych: Denies hallucinations +anxiety, depression   BP 112/90 (BP Location: Left Arm, Patient Position: Sitting, Cuff Size: Large)   Pulse 65   Temp 99 F (37.2 C) (Oral)   Wt 262 lb 6.4 oz (119 kg)   SpO2 96%   BMI 43.67 kg/m   Physical Exam Gen. Pleasant, well developed, well-nourished, in NAD HEENT - [place]/AT, PERRL, EOMI, conjunctive clear, no scleral icterus, no nasal drainage, pharynx without erythema or exudate.  TMs normal bilaterally. Lungs: no use of accessory muscles, CTAB, no wheezes, rales or rhonchi Cardiovascular: RRR, No r/g/m, no peripheral edema Abdomen: BS present, soft, nontender,nondistended Musculoskeletal: No deformities, moves all four extremities, no cyanosis or clubbing, normal tone Neuro:  A&Ox3, CN II-XII intact, normal gait Skin:  Warm, dry, intact, no lesions Psych: normal affect, mood appropriate  No results found for this or any previous visit (from the past 2160 hour(s)). PHQ9 SCORE ONLY 06/10/2021  PHQ-9 Total Score 16   GAD 7 : Generalized Anxiety Score 06/10/2021  Nervous, Anxious, on Edge 2  Control/stop worrying 3  Worry too much - different things 3  Trouble relaxing 2  Restless 1  Easily annoyed or irritable 3  Afraid - awful might happen 3  Total GAD 7 Score 17  Anxiety  Difficulty Somewhat difficult    Assessment/Plan: ADHD (attention deficit hyperactivity disorder), combined type -Increased after medication adjustment -Increased inattention with [facility] provider -Discussed ways to refocus -For now continue Vyvanse 20 mg daily  Anxiety -Increased -GAD-7 score 17 this visit -Continue current medications including -Patient advised to schedule appointment with [facility] provider at [facility] psychological in the next few weeks -Discussed deep breathing and other ways to reduce stress including self-care  Depression, major, single episode, moderate (HCC) -Increased -PHQ-9 score 16 -Continue current medications -Continue following with [facility] encouraged to schedule appointment -Given strict precautions  Morbid obesity ([place]) -Lifestyle modifications -Patient encouraged to consider walking for exercise few times per week -Discussed obtaining labs at [facility]  Encounter to establish care -We reviewed the PMH, PSH, FH, SH, Meds and Allergies. -We provided refills for any medications we will prescribe as needed. -We addressed current concerns per orders and patient instructions. -We have asked for records for pertinent exams, studies, vaccines and notes from previous providers. -We have advised patient  to follow up per instructions below.  F/u as needed in the next few weeks for [facility]  Abbe Amsterdam, MD

## 2021-06-15 DIAGNOSIS — F3160 Bipolar disorder, current episode mixed, unspecified: Secondary | ICD-10-CM | POA: Diagnosis not present

## 2021-06-15 DIAGNOSIS — F908 Attention-deficit hyperactivity disorder, other type: Secondary | ICD-10-CM | POA: Diagnosis not present

## 2021-06-15 DIAGNOSIS — F603 Borderline personality disorder: Secondary | ICD-10-CM | POA: Diagnosis not present

## 2021-06-17 ENCOUNTER — Encounter: Payer: BC Managed Care – PPO | Admitting: Family Medicine

## 2021-07-15 ENCOUNTER — Encounter: Payer: Self-pay | Admitting: Family Medicine

## 2021-07-15 ENCOUNTER — Ambulatory Visit (INDEPENDENT_AMBULATORY_CARE_PROVIDER_SITE_OTHER): Payer: BC Managed Care – PPO | Admitting: Family Medicine

## 2021-07-15 VITALS — BP 120/80 | HR 64 | Temp 99.5°F | Ht 65.0 in | Wt 260.2 lb

## 2021-07-15 DIAGNOSIS — E049 Nontoxic goiter, unspecified: Secondary | ICD-10-CM

## 2021-07-15 DIAGNOSIS — F419 Anxiety disorder, unspecified: Secondary | ICD-10-CM | POA: Diagnosis not present

## 2021-07-15 DIAGNOSIS — Z1159 Encounter for screening for other viral diseases: Secondary | ICD-10-CM | POA: Diagnosis not present

## 2021-07-15 DIAGNOSIS — Z23 Encounter for immunization: Secondary | ICD-10-CM | POA: Diagnosis not present

## 2021-07-15 DIAGNOSIS — Z0000 Encounter for general adult medical examination without abnormal findings: Secondary | ICD-10-CM

## 2021-07-15 LAB — CBC WITH DIFFERENTIAL/PLATELET
Basophils Absolute: 0 10*3/uL (ref 0.0–0.1)
Basophils Relative: 0.2 % (ref 0.0–3.0)
Eosinophils Absolute: 0.1 10*3/uL (ref 0.0–0.7)
Eosinophils Relative: 1.6 % (ref 0.0–5.0)
HCT: 41.5 % (ref 36.0–46.0)
Hemoglobin: 13.4 g/dL (ref 12.0–15.0)
Lymphocytes Relative: 24 % (ref 12.0–46.0)
Lymphs Abs: 2.2 10*3/uL (ref 0.7–4.0)
MCHC: 32.3 g/dL (ref 30.0–36.0)
MCV: 82.6 fl (ref 78.0–100.0)
Monocytes Absolute: 0.5 10*3/uL (ref 0.1–1.0)
Monocytes Relative: 5.3 % (ref 3.0–12.0)
Neutro Abs: 6.3 10*3/uL (ref 1.4–7.7)
Neutrophils Relative %: 68.9 % (ref 43.0–77.0)
Platelets: 421 10*3/uL — ABNORMAL HIGH (ref 150.0–400.0)
RBC: 5.02 Mil/uL (ref 3.87–5.11)
RDW: 14.5 % (ref 11.5–15.5)
WBC: 9.1 10*3/uL (ref 4.0–10.5)

## 2021-07-15 LAB — COMPREHENSIVE METABOLIC PANEL
ALT: 15 U/L (ref 0–35)
AST: 18 U/L (ref 0–37)
Albumin: 4 g/dL (ref 3.5–5.2)
Alkaline Phosphatase: 109 U/L (ref 39–117)
BUN: 6 mg/dL (ref 6–23)
CO2: 24 mEq/L (ref 19–32)
Calcium: 9.2 mg/dL (ref 8.4–10.5)
Chloride: 104 mEq/L (ref 96–112)
Creatinine, Ser: 0.72 mg/dL (ref 0.40–1.20)
GFR: 119.18 mL/min (ref >60.00)
Glucose, Bld: 84 mg/dL (ref 70–99)
Potassium: 4.9 mEq/L (ref 3.5–5.1)
Sodium: 136 mEq/L (ref 135–145)
Total Bilirubin: 0.5 mg/dL (ref 0.2–1.2)
Total Protein: 7.4 g/dL (ref 6.0–8.3)

## 2021-07-15 LAB — TSH: TSH: 1.59 u[IU]/mL (ref 0.35–5.50)

## 2021-07-15 LAB — LIPID PANEL
Cholesterol: 175 mg/dL (ref 0–200)
HDL: 48.8 mg/dL (ref >39.00)
LDL Cholesterol: 107 mg/dL — ABNORMAL HIGH (ref 0–99)
NonHDL: 125.94
Total CHOL/HDL Ratio: 4
Triglycerides: 94 mg/dL (ref 0.0–149.0)
VLDL: 18.8 mg/dL (ref 0.0–40.0)

## 2021-07-15 LAB — HEMOGLOBIN A1C: Hgb A1c MFr Bld: 5.3 % (ref 4.6–6.5)

## 2021-07-15 LAB — VITAMIN B12: Vitamin B-12: 191 pg/mL — ABNORMAL LOW (ref 211–911)

## 2021-07-15 LAB — T4, FREE: Free T4: 0.81 ng/dL (ref 0.60–1.60)

## 2021-07-15 LAB — VITAMIN D 25 HYDROXY (VIT D DEFICIENCY, FRACTURES): VITD: 7.7 ng/mL — ABNORMAL LOW (ref 30.00–100.00)

## 2021-07-15 NOTE — Patient Instructions (Signed)
An order for thyroid ultrasound was placed at today's visit.  You should expect a call about scheduling this appointment.  Consider having a Pap done to screen for cervical cancer.

## 2021-07-15 NOTE — Progress Notes (Signed)
Subjective:     Maria Coin is a 21 y.o. female and is here for a comprehensive physical exam. The patient reports no concerns.  Pt declines pap. Interested in influenza vaccine.  Social History   Socioeconomic History   Marital status: Single    Spouse name: Not on file   Number of children: Not on file   Years of education: Not on file   Highest education level: Not on file  Occupational History   Not on file  Tobacco Use   Smoking status: Never   Smokeless tobacco: Never  Substance and Sexual Activity   Alcohol use: No    Alcohol/week: 0.0 standard drinks   Drug use: No   Sexual activity: Never  Other Topics Concern   Not on file  Social History Narrative   Work or School: [employer]      Home Situation: lives with mother, sister and brother      Spiritual Beliefs: Baha'i      Lifestyle: no regular exercise, does PE at school; diet is healthy         Social Determinants of Health   Financial Resource Strain: Not on file  Food Insecurity: Not on file  Transportation Needs: Not on file  Physical Activity: Not on file  Stress: Not on file  Social Connections: Not on file  Intimate Partner Violence: Not on file   Health Maintenance  Topic Date Due   COVID-19 Vaccine (1) Never done   HPV VACCINES (1 - 2-dose series) Never done   HIV Screening  Never done   Hepatitis C Screening  Never done   PAP SMEAR-Modifier  07/15/2021 (Originally 09/18/2020)   PAP-Cervical Cytology Screening  07/15/2022 (Originally 09/18/2020)   TETANUS/TDAP  09/24/2030   INFLUENZA VACCINE  Completed   Pneumococcal Vaccine 37-57 Years old  Aged Out    The following portions of the patient's history were reviewed and updated as appropriate: allergies, current medications, past family history, past medical history, past social history, past surgical history, and problem list.  Review of Systems Pertinent items noted in HPI and remainder of comprehensive ROS otherwise negative.   Objective:     BP 120/80 (BP Location: Left Arm, Patient Position: Sitting, Cuff Size: Normal)    Pulse 64    Temp 99.5 F (37.5 C) (Oral)    Ht 5\' 5"  (1.651 m)    Wt 260 lb 3.2 oz (118 kg)    LMP  (LMP Unknown)    SpO2 98%    BMI 43.30 kg/m  General appearance: alert, cooperative, and no distress Head: Normocephalic, without obvious abnormality, atraumatic Eyes: conjunctivae/corneas clear. PERRL, EOM's intact. Fundi benign. Ears: normal TM's and external ear canals both ears Nose: Nares normal. Septum midline. Mucosa normal. No drainage or sinus tenderness. Throat: lips, mucosa, and tongue normal; teeth and gums normal Neck: no adenopathy, no carotid bruit, no JVD, supple, symmetrical, trachea midline, and thyroid enlarged, symmetric, with 2 mobile nodules ~ 3-4 mm in R lobe, no tenderness/mass. Lungs: clear to auscultation bilaterally Heart: regular rate and rhythm, S1, S2 normal, no murmur, click, rub or gallop Abdomen: soft, non-tender; bowel sounds normal; no masses,  no organomegaly Extremities: extremities normal, atraumatic, no cyanosis or edema Pulses: 2+ and symmetric Skin: Skin color, texture, turgor normal. No rashes or lesions Lymph nodes: Cervical, supraclavicular, and axillary nodes normal. Neurologic: Alert and oriented X 3, normal strength and tone. Normal symmetric reflexes. Normal coordination and gait   GAD 7 : Generalized Anxiety Score  06/10/2021  Nervous, Anxious, on Edge 2  Control/stop worrying 3  Worry too much - different things 3  Trouble relaxing 2  Restless 1  Easily annoyed or irritable 3  Afraid - awful might happen 3  Total GAD 7 Score 17  Anxiety Difficulty Somewhat difficult    Depression screen PHQ 2/9 06/10/2021  Decreased Interest 1  Down, Depressed, Hopeless 2  PHQ - 2 Score 3  Altered sleeping 1  Tired, decreased energy 3  Change in appetite 3  Feeling bad or failure about yourself  2  Trouble concentrating 2  Moving slowly or fidgety/restless 2   Suicidal thoughts 0  PHQ-9 Score 16      Assessment:    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     Plan:    Anticipatory guidance given including wearing seatbelts, smoke detectors in the home, increasing physical activity, increasing p.o. intake of water and vegetables. -obtain labs -pap declined. -immunizations reviewed.  Interested in influenza vaccine.   See After Visit Summary for Counseling Recommendations   Need for immunization against influenza  - Plan: Flu Vaccine QUAD 45mo+IM (Fluarix, Fluzone & Alfiuria Quad PF)  Goiter, nodular -Nodules appreciated on exam in the right lobe  - Plan: US THYROID, TSH, T4, Free  Morbid obesity (HCC)  -Lifestyle modifications encouraged - Plan: CBC with Differential/Platelet, Hemoglobin A1c, Lipid panel, CMP, Vitamin D, 25-hydroxy  Anxiety  -Continue follow-up with [facility]  - Plan: TSH, T4, Free, Vitamin B12  Encounter for hepatitis C screening test for low risk patient - Plan: Hep C Antibody  F/u in 1 month, sooner if needed  Abbe Amsterdam, MD

## 2021-07-16 LAB — HEPATITIS C ANTIBODY
Hepatitis C Ab: NONREACTIVE
SIGNAL TO CUT-OFF: 0.08 (ref <1.00)

## 2021-07-24 ENCOUNTER — Ambulatory Visit
Admission: RE | Admit: 2021-07-24 | Discharge: 2021-07-24 | Disposition: A | Discharge status: Home / Self Care | Payer: BC Managed Care – PPO | Source: Ambulatory Visit | Attending: Family Medicine | Admitting: Family Medicine

## 2021-07-24 DIAGNOSIS — R221 Localized swelling, mass and lump, neck: Secondary | ICD-10-CM | POA: Diagnosis not present

## 2021-07-24 DIAGNOSIS — E049 Nontoxic goiter, unspecified: Secondary | ICD-10-CM

## 2021-07-29 ENCOUNTER — Other Ambulatory Visit: Payer: Self-pay | Admitting: Family Medicine

## 2021-07-29 DIAGNOSIS — E559 Vitamin D deficiency, unspecified: Secondary | ICD-10-CM | POA: Insufficient documentation

## 2021-07-29 DIAGNOSIS — M9902 Segmental and somatic dysfunction of thoracic region: Secondary | ICD-10-CM | POA: Diagnosis not present

## 2021-07-29 MED ORDER — VITAMIN D (ERGOCALCIFEROL) 1.25 MG (50000 UNIT) PO CAPS: 50000.0000 [IU] | ORAL_CAPSULE | ORAL | 0 refills | Status: DC

## 2021-07-31 MED ORDER — LEVONORGEST-ETH ESTRAD 91-DAY 0.15-0.03 MG PO TABS: 1.0000 | ORAL_TABLET | Freq: Every day | ORAL | 3 refills | Status: DC

## 2021-08-09 ENCOUNTER — Other Ambulatory Visit: Payer: Self-pay | Admitting: Family Medicine

## 2021-08-12 DIAGNOSIS — F603 Borderline personality disorder: Secondary | ICD-10-CM | POA: Diagnosis not present

## 2021-08-12 DIAGNOSIS — F908 Attention-deficit hyperactivity disorder, other type: Secondary | ICD-10-CM | POA: Diagnosis not present

## 2021-08-12 DIAGNOSIS — F3160 Bipolar disorder, current episode mixed, unspecified: Secondary | ICD-10-CM | POA: Diagnosis not present

## 2021-08-13 NOTE — Telephone Encounter (Signed)
Pt states psychiatrist was the original prescriber for this medication. States she has since gotten in touch with them to prescribe this medication.

## 2021-08-19 ENCOUNTER — Ambulatory Visit: Payer: BC Managed Care – PPO | Admitting: Family Medicine

## 2021-08-19 DIAGNOSIS — N898 Other specified noninflammatory disorders of vagina: Secondary | ICD-10-CM | POA: Diagnosis not present

## 2021-08-19 DIAGNOSIS — Z01419 Encounter for gynecological examination (general) (routine) without abnormal findings: Secondary | ICD-10-CM | POA: Diagnosis not present

## 2021-08-19 DIAGNOSIS — Z113 Encounter for screening for infections with a predominantly sexual mode of transmission: Secondary | ICD-10-CM | POA: Diagnosis not present

## 2021-08-19 DIAGNOSIS — Z6841 Body Mass Index (BMI) 40.0 and over, adult: Secondary | ICD-10-CM | POA: Diagnosis not present

## 2021-08-19 DIAGNOSIS — Z1151 Encounter for screening for human papillomavirus (HPV): Secondary | ICD-10-CM | POA: Diagnosis not present

## 2021-08-19 DIAGNOSIS — Z124 Encounter for screening for malignant neoplasm of cervix: Secondary | ICD-10-CM | POA: Diagnosis not present

## 2021-09-14 DIAGNOSIS — H5212 Myopia, left eye: Secondary | ICD-10-CM | POA: Diagnosis not present

## 2021-10-10 DIAGNOSIS — F603 Borderline personality disorder: Secondary | ICD-10-CM | POA: Diagnosis not present

## 2021-10-10 DIAGNOSIS — F908 Attention-deficit hyperactivity disorder, other type: Secondary | ICD-10-CM | POA: Diagnosis not present

## 2021-10-10 DIAGNOSIS — F3160 Bipolar disorder, current episode mixed, unspecified: Secondary | ICD-10-CM | POA: Diagnosis not present

## 2021-10-23 ENCOUNTER — Other Ambulatory Visit: Payer: Self-pay | Admitting: Family Medicine

## 2021-10-23 DIAGNOSIS — E559 Vitamin D deficiency, unspecified: Secondary | ICD-10-CM

## 2021-11-19 DIAGNOSIS — Z0289 Encounter for other administrative examinations: Secondary | ICD-10-CM

## 2021-11-21 ENCOUNTER — Ambulatory Visit (INDEPENDENT_AMBULATORY_CARE_PROVIDER_SITE_OTHER): Payer: BC Managed Care – PPO | Admitting: Family Medicine

## 2021-11-21 ENCOUNTER — Encounter (INDEPENDENT_AMBULATORY_CARE_PROVIDER_SITE_OTHER): Payer: Self-pay | Admitting: Family Medicine

## 2021-11-21 VITALS — BP 113/75 | HR 81 | Temp 98.4°F | Ht 66.0 in | Wt 266.0 lb

## 2021-11-21 DIAGNOSIS — F902 Attention-deficit hyperactivity disorder, combined type: Secondary | ICD-10-CM

## 2021-11-21 DIAGNOSIS — R5383 Other fatigue: Secondary | ICD-10-CM

## 2021-11-21 DIAGNOSIS — E7849 Other hyperlipidemia: Secondary | ICD-10-CM | POA: Diagnosis not present

## 2021-11-21 DIAGNOSIS — E559 Vitamin D deficiency, unspecified: Secondary | ICD-10-CM | POA: Diagnosis not present

## 2021-11-21 DIAGNOSIS — Z6841 Body Mass Index (BMI) 40.0 and over, adult: Secondary | ICD-10-CM

## 2021-11-21 DIAGNOSIS — R0602 Shortness of breath: Secondary | ICD-10-CM | POA: Diagnosis not present

## 2021-11-21 DIAGNOSIS — E7800 Pure hypercholesterolemia, unspecified: Secondary | ICD-10-CM | POA: Diagnosis not present

## 2021-11-21 DIAGNOSIS — F5089 Other specified eating disorder: Secondary | ICD-10-CM

## 2021-11-21 DIAGNOSIS — E668 Other obesity: Secondary | ICD-10-CM

## 2021-11-21 DIAGNOSIS — F419 Anxiety disorder, unspecified: Secondary | ICD-10-CM

## 2021-11-21 DIAGNOSIS — E538 Deficiency of other specified B group vitamins: Secondary | ICD-10-CM

## 2021-11-21 DIAGNOSIS — F32A Depression, unspecified: Secondary | ICD-10-CM | POA: Diagnosis not present

## 2021-11-22 LAB — CBC WITH DIFFERENTIAL/PLATELET
Basophils Absolute: 0 10*3/uL (ref 0.0–0.2)
Basos: 0 %
EOS (ABSOLUTE): 0.3 10*3/uL (ref 0.0–0.4)
Eos: 3 %
Hematocrit: 43.8 % (ref 34.0–46.6)
Hemoglobin: 14.1 g/dL (ref 11.1–15.9)
Immature Grans (Abs): 0 10*3/uL (ref 0.0–0.1)
Immature Granulocytes: 0 %
Lymphocytes Absolute: 2.8 10*3/uL (ref 0.7–3.1)
Lymphs: 25 %
MCH: 27 pg (ref 26.6–33.0)
MCHC: 32.2 g/dL (ref 31.5–35.7)
MCV: 84 fL (ref 79–97)
Monocytes Absolute: 0.6 10*3/uL (ref 0.1–0.9)
Monocytes: 5 %
Neutrophils Absolute: 7.6 10*3/uL — ABNORMAL HIGH (ref 1.4–7.0)
Neutrophils: 67 %
Platelets: 487 10*3/uL — ABNORMAL HIGH (ref 150–450)
RBC: 5.23 x10E6/uL (ref 3.77–5.28)
RDW: 14.4 % (ref 11.7–15.4)
WBC: 11.4 10*3/uL — ABNORMAL HIGH (ref 3.4–10.8)

## 2021-11-22 LAB — LIPID PANEL WITH LDL/HDL RATIO
Cholesterol, Total: 187 mg/dL (ref 100–199)
HDL: 56 mg/dL (ref >39)
LDL Chol Calc (NIH): 109 mg/dL — ABNORMAL HIGH (ref 0–99)
LDL/HDL Ratio: 1.9 ratio (ref 0.0–3.2)
Triglycerides: 124 mg/dL (ref 0–149)
VLDL Cholesterol Cal: 22 mg/dL (ref 5–40)

## 2021-11-22 LAB — COMPREHENSIVE METABOLIC PANEL
ALT: 16 IU/L (ref 0–32)
AST: 15 IU/L (ref 0–40)
Albumin/Globulin Ratio: 1.2 (ref 1.2–2.2)
Albumin: 4.1 g/dL (ref 3.9–5.0)
Alkaline Phosphatase: 147 IU/L — ABNORMAL HIGH (ref 44–121)
BUN/Creatinine Ratio: 14 (ref 9–23)
BUN: 9 mg/dL (ref 6–20)
Bilirubin Total: 0.3 mg/dL (ref 0.0–1.2)
CO2: 20 mmol/L (ref 20–29)
Calcium: 9.5 mg/dL (ref 8.7–10.2)
Chloride: 102 mmol/L (ref 96–106)
Creatinine, Ser: 0.66 mg/dL (ref 0.57–1.00)
Globulin, Total: 3.3 g/dL (ref 1.5–4.5)
Glucose: 82 mg/dL (ref 70–99)
Potassium: 4.7 mmol/L (ref 3.5–5.2)
Sodium: 136 mmol/L (ref 134–144)
Total Protein: 7.4 g/dL (ref 6.0–8.5)
eGFR: 127 mL/min/{1.73_m2} (ref >59)

## 2021-11-22 LAB — TSH: TSH: 4.49 u[IU]/mL (ref 0.450–4.500)

## 2021-11-22 LAB — VITAMIN B12: Vitamin B-12: 266 pg/mL (ref 232–1245)

## 2021-11-22 LAB — HEMOGLOBIN A1C
Est. average glucose Bld gHb Est-mCnc: 108 mg/dL
Hgb A1c MFr Bld: 5.4 % (ref 4.8–5.6)

## 2021-11-22 LAB — VITAMIN D 25 HYDROXY (VIT D DEFICIENCY, FRACTURES): Vit D, 25-Hydroxy: 33 ng/mL (ref 30.0–100.0)

## 2021-11-22 LAB — T4, FREE: Free T4: 1.14 ng/dL (ref 0.82–1.77)

## 2021-11-22 LAB — T3: T3, Total: 223 ng/dL — ABNORMAL HIGH (ref 71–180)

## 2021-11-22 LAB — FOLATE: Folate: 6.7 ng/mL (ref >3.0)

## 2021-11-22 LAB — INSULIN, RANDOM: INSULIN: 31.5 u[IU]/mL — ABNORMAL HIGH (ref 2.6–24.9)

## 2021-11-25 NOTE — Progress Notes (Signed)
Chief Complaint:   OBESITY Jean Stone (MR# [id number]) is a 21 y.o. Jean Stone who presents for evaluation and treatment of obesity and related comorbidities. Current BMI is Body mass index is 42.93 kg/m. Jean Stone has been struggling with her weight for many years and has been unsuccessful in either losing weight, maintaining weight loss, or reaching her healthy weight goal.  Jean Stone was told about the clinic from her mom. She doesn't like chicken or cooked vegetables. She lives with her boyfriend. He is supportive of her and has changed how they eat. She is skipping breakfast and sometimes lunch. If she eats lunch it would be Chipot[employer] 1/3 burrito-chicken, sour cream, lettuce, and tomatoes (breakfast eats rest). She may drink a Gatorade. Dinner at 9 pm Tacos or Ramen or [employer]. She will eat while watching tv like chips, ice cream, or pickles.    Jean Stone is currently in the action stage of change and ready to dedicate time achieving and maintaining a healthier weight. Jean Stone is interested in becoming our patient and working on intensive lifestyle modifications including (but not limited to) diet and exercise for weight loss.  Jean Stone's habits were reviewed today and are as follows: Her family eats meals together, her desired weight loss is 66-76 pounds, she started gaining weight in March of 2019, her heaviest weight ever was 260 pounds, she is a picky eater and doesn't like to eat healthier foods, she has significant food cravings issues, she snacks frequently in the evenings, she skips meals frequently, she is frequently drinking liquids with calories, she frequently makes poor food choices, she frequently eats larger portions than normal, she has binge eating behaviors, and she struggles with emotional eating.  Depression Screen Jean Stone's Food and Mood (modified PHQ-9) score was 17.     11/21/2021    8:35 AM  Depression screen PHQ 2/9  Decreased Interest 1  Down, Depressed, Hopeless 3   PHQ - 2 Score 4  Altered sleeping 1  Tired, decreased energy 3  Change in appetite 1  Feeling bad or failure about yourself  3  Trouble concentrating 3  Moving slowly or fidgety/restless 1  Suicidal thoughts 1  PHQ-9 Score 17  Difficult doing work/chores Somewhat difficult   Subjective:   1. Other fatigue Jean Stone admits to daytime somnolence and admits to waking up still tired. Patient has a history of symptoms of daytime fatigue, morning fatigue, and morning headache. Jean Stone generally gets 9 or 10 hours of sleep per night, and states that she has nightime awakenings and generally restful sleep. Snoring is present. Apneic episodes are not present. Epworth Sleepiness Score is 18. Jean Stone's EKG showed normal sinus rhythm at 100 bpm.   2. SOBOE (shortness of breath on exertion) Jean Stone notes increasing shortness of breath with exercising and seems to be worsening over time with weight gain. She notes getting out of breath sooner with activity than she used to. This has not gotten worse recently. Jean Stone denies shortness of breath at rest or orthopnea.   3. ADHD (attention deficit hyperactivity disorder), combined type Jean Stone was diagnosed in elementary school. She was prescribed Vyvanse by Dr. Cristino Martes. She does not feel it is particularly well managed.  4. Vitamin D deficiency Jean Stone's last Vitamin D level was 7.70 in January. She is not on over the counter now.   5. B12 deficiency Jean Stone's last Vitamin B 12 level was 266. She does not have a history of pernicious anemia, or bariatric surgery.   6. Other hyperlipidemia Jean Stone's  last LDL was 107. HDL was 48.8. Her Triglycerides was 94. She is not on medications.   7. Other disorder of eating, disordered eating Jean Stone is struggling with concerns of binge/overeating. She is currently on Vyvanse per her psychiatrist. She is not on purge after overeating.   8. Anxiety and depression Jean Stone is currently on Lamictal. She does  not feel well managed unfortunately. She worries about binge eating.   Assessment/Plan:   1. Other fatigue Jean Stone does not feel that her weight is causing her energy to be lower than it should be. Fatigue may be related to obesity, depression or many other causes. Labs will be ordered, and in the meanwhile, Jean Stone will focus on self care including making healthy food choices, increasing physical activity and focusing on stress reduction. We reviewed EKG, IC, and labs today.   - EKG 12-Lead - Folate - Hemoglobin A1c - Insulin, random - T3 - T4, free - TSH  2. SOBOE (shortness of breath on exertion) Jean Stone does not feel that she gets out of breath more easily that she used to when she exercises. Jean Stone's shortness of breath appears to be obesity related and exercise induced. She has agreed to work on weight loss and gradually increase exercise to treat her exercise induced shortness of breath. Will continue to monitor closely.  - CBC with Differential/Platelet - Comprehensive metabolic panel  3. ADHD (attention deficit hyperactivity disorder), combined type Jean Stone will follow up with Dr. Cristino Martes for further management.   4. Vitamin D deficiency Low Vitamin D level contributes to fatigue and are associated with obesity, breast, and colon cancer. We will check Vitamin D level today and Jean Stone will follow-up for routine testing of Vitamin D, at least 2-3 times per year to avoid over-replacement.  - VITAMIN D 25 Hydroxy (Vit-D Deficiency, Fractures)  5. B12 deficiency The diagnosis was reviewed with the patient. We will check Vitamin B12 today. Counseling provided today, see below. We will continue to monitor. Orders and follow up as documented in patient record.  Counseling The body needs vitamin B12: to make red blood cells; to make DNA; and to help the nerves work properly so they can carry messages from the brain to the body.  The main causes of vitamin B12 deficiency include  dietary deficiency, digestive diseases, pernicious anemia, and having a surgery in which part of the stomach or small intestine is removed.  Certain medicines can make it harder for the body to absorb vitamin B12. These medicines include: heartburn medications; some antibiotics; some medications used to treat diabetes, gout, and high cholesterol.  In some cases, there are no symptoms of this condition. If the condition leads to anemia or nerve damage, various symptoms can occur, such as weakness or fatigue, shortness of breath, and numbness or tingling in your hands and feet.   Treatment:  May include taking vitamin B12 supplements.  Avoid alcohol.  Eat lots of healthy foods that contain vitamin B12: Beef, pork, chicken, [place], and organ meats, such as liver.  Seafood: This includes clams, rainbow trout, salmon, tuna, and haddock. Eggs.  Cereal and dairy products that are fortified: This means that vitamin B12 has been added to the food.    - Vitamin B12  6. Other hyperlipidemia Cardiovascular risk and specific lipid/LDL goals reviewed.  Lipid panel was obtained today. We discussed several lifestyle modifications today and Mishell will continue to work on diet, exercise and weight loss efforts. Orders and follow up as documented in patient record.  Counseling Intensive lifestyle modifications are the first line treatment for this issue. Dietary changes: Increase soluble fiber. Decrease simple carbohydrates. Exercise changes: Moderate to vigorous-intensity aerobic activity 150 minutes per week if tolerated. Lipid-lowering medications: see documented in medical record.  - Lipid Panel With LDL/HDL Ratio  7. Other disorder of eating, disordered eating Annaliesa was referred to Dr. Dewaine Conger our Bariatric Psychologist. Behavior modification techniques were discussed today to help Xitlally deal with her emotional/non-hunger eating behaviors.  Orders and follow up as documented in patient record.    8. Anxiety and depression Behavior modification techniques were discussed today to help Jeana deal with her emotional/non-hunger eating behaviors.  We will follow up on symptoms at next appointment. Orders and follow up as documented in patient record.   9. Class 3 severe obesity with serious comorbidity and body mass index (BMI) of 40.0 to 44.9 in adult, unspecified obesity type (HCC) Aneya is currently in the action stage of change and her goal is to continue with weight loss efforts. I recommend Adrijana begin the structured treatment plan as follows:  She has agreed to the Category 4 Plan and keeping a food journal and adhering to recommended goals of 1700-1800 calories and 120 plus grams of protein daily.  Exercise goals: No exercise has been prescribed at this time.   Behavioral modification strategies: increasing lean protein intake, meal planning and cooking strategies, keeping healthy foods in the home, and planning for success.  She was informed of the importance of frequent follow-up visits to maximize her success with intensive lifestyle modifications for her multiple health conditions. She was informed we would discuss her lab results at her next visit unless there is a critical issue that needs to be addressed sooner. Chihiro agreed to keep her next visit at the agreed upon time to discuss these results.  Objective:   Blood pressure 113/75, pulse 81, temperature 98.4 F (36.9 C), height 5\' 6"  (1.676 m), weight 266 lb (120.7 kg), SpO2 96 %. Body mass index is 42.93 kg/m.  EKG: Normal sinus rhythm, rate 100 bpm.  Indirect Calorimeter completed today shows a VO2 of 332 and a REE of 2290.  Her calculated basal metabolic rate is thus her basal metabolic rate is better than expected.  General: Cooperative, alert, well developed, in no acute distress. HEENT: Conjunctivae and lids unremarkable. Cardiovascular: Regular rhythm.  Lungs: Normal work of breathing. Neurologic:  No focal deficits.   Lab Results  Component Value Date   CREATININE 0.66 11/21/2021   BUN 9 11/21/2021   NA 136 11/21/2021   K 4.7 11/21/2021   CL 102 11/21/2021   CO2 20 11/21/2021   Lab Results  Component Value Date   ALT 16 11/21/2021   AST 15 11/21/2021   ALKPHOS 147 (H) 11/21/2021   BILITOT 0.3 11/21/2021   Lab Results  Component Value Date   HGBA1C 5.4 11/21/2021   HGBA1C 5.3 07/15/2021   Lab Results  Component Value Date   INSULIN 31.5 (H) 11/21/2021   Lab Results  Component Value Date   TSH 4.490 11/21/2021   Lab Results  Component Value Date   CHOL 187 11/21/2021   HDL 56 11/21/2021   LDLCALC 109 (H) 11/21/2021   TRIG 124 11/21/2021   CHOLHDL 4 07/15/2021   Lab Results  Component Value Date   WBC 11.4 (H) 11/21/2021   HGB 14.1 11/21/2021   HCT 43.8 11/21/2021   MCV 84 11/21/2021   PLT 487 (H) 11/21/2021   No results  found for: IRON, TIBC, FERRITIN  Attestation Statements:   Reviewed by clinician on day of visit: allergies, medications, problem list, medical history, surgical history, family history, social history, and previous encounter notes.  Time spent on visit including pre-visit chart review and post-visit charting and care was 46 minutes.   I, Jackson LatinoAlisa White, RMA, am acting as Energy managertranscriptionist for Reuben LikesAlexandria Sheng Pritz, MD. This is the patient's first visit at [facility]. The patient's NEW PATIENT PACKET was reviewed at length. Included in the packet: current and past health history, medications, allergies, ROS, gynecologic history (women only), surgical history, family history, social history, weight history, weight loss surgery history (for those that have had weight loss surgery), nutritional evaluation, mood and food questionnaire, PHQ9, Epworth questionnaire, sleep habits questionnaire, patient life and health improvement goals questionnaire. These will all be scanned into the patient's chart under media.   During the visit, I  independently reviewed the patient's EKG, bioimpedance scale results, and indirect calorimeter results. I used this information to tailor a meal plan for the patient that will help her to lose weight and will improve her obesity-related conditions going forward. I performed a medically necessary appropriate examination and/or evaluation. I discussed the assessment and treatment plan with the patient. The patient was provided an opportunity to ask questions and all were answered. The patient agreed with the plan and demonstrated an understanding of the instructions. Labs were ordered at this visit and will be reviewed at the next visit unless more critical results need to be addressed immediately. Clinical information was updated and documented in the EMR.   Time spent on visit including pre-visit chart review and post-visit care was45 minutes.     I have reviewed the above documentation for accuracy and completeness, and I agree with the above. - Reuben LikesAlexandria Josceline Chenard, MD
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
## 2021-12-05 DIAGNOSIS — F3160 Bipolar disorder, current episode mixed, unspecified: Secondary | ICD-10-CM | POA: Diagnosis not present

## 2021-12-05 DIAGNOSIS — F603 Borderline personality disorder: Secondary | ICD-10-CM | POA: Diagnosis not present

## 2021-12-05 DIAGNOSIS — F908 Attention-deficit hyperactivity disorder, other type: Secondary | ICD-10-CM | POA: Diagnosis not present

## 2021-12-09 ENCOUNTER — Ambulatory Visit (INDEPENDENT_AMBULATORY_CARE_PROVIDER_SITE_OTHER): Payer: BLUE CROSS/BLUE SHIELD | Admitting: Family Medicine

## 2021-12-10 ENCOUNTER — Ambulatory Visit: Payer: BC Managed Care – PPO | Admitting: Physician Assistant

## 2021-12-16 ENCOUNTER — Telehealth (INDEPENDENT_AMBULATORY_CARE_PROVIDER_SITE_OTHER): Payer: BC Managed Care – PPO | Admitting: Psychology

## 2021-12-16 DIAGNOSIS — F330 Major depressive disorder, recurrent, mild: Secondary | ICD-10-CM

## 2021-12-16 DIAGNOSIS — F5089 Other specified eating disorder: Secondary | ICD-10-CM

## 2021-12-16 DIAGNOSIS — F909 Attention-deficit hyperactivity disorder, unspecified type: Secondary | ICD-10-CM | POA: Diagnosis not present

## 2021-12-17 NOTE — Progress Notes (Unsigned)
  Office: [phone]  /  Fax: [phone]    Date: 12/31/2021   Appointment Start Time: *** Duration: *** minutes Provider: Lawerance Cruel, Psy.D. Type of Session: Individual Therapy  Location of Patient: {gbptloc:23249} (private location) Location of Provider: Provider's Home (private office) Type of Contact: Telepsychological Visit via MyChart Video Visit  Session Content: Jean Stone is a 21 y.o. female presenting for a follow-up appointment to address the previously established treatment goal of increasing coping skills.Today's appointment was a telepsychological visit. Jean Stone provided verbal consent for today's telepsychological appointment and she is aware she is responsible for securing confidentiality on her end of the session. Prior to proceeding with today's appointment, Jean Stone's physical location at the time of this appointment was obtained as well a phone number she could be reached at in the event of technical difficulties. Jean Stone and this provider participated in today's telepsychological service.   This provider conducted a brief check-in. *** Jean Stone was receptive to today's appointment as evidenced by openness to sharing, responsiveness to feedback, and {gbreceptiveness:23401}.  Mental Status Examination:  Appearance: {Appearance:22431} Behavior: {Behavior:22445} Mood: {gbmood:21757} Affect: {Affect:22436} Speech: {Speech:22432} Eye Contact: {Eye Contact:22433} Psychomotor Activity: {Motor Activity:22434} Gait: {gbgait:23404} Thought Process: {thought process:22448}  Thought Content/Perception: {disturbances:22451} Orientation: {Orientation:22437} Memory/Concentration: {gbcognition:22449} Insight: {Insight:22446} Judgment: {Insight:22446}  Interventions:  {Interventions for Progress Notes:23405}  DSM-5 Diagnosis(es):  F50.89 Other Specified Feeding or Eating Disorder, Emotional and Binge Eating Behaviors, F33.0 Major Depressive Disorder, Recurrent Episode, Mild,  With Anxious Distress, and F90.9 Unspecified Attention-Deficit/Hyperactivity Disorder  Treatment Goal & Progress: During the initial appointment with this provider, the following treatment goal was established: increase coping skills. Jean Stone has demonstrated progress in her goal as evidenced by {gbtxprogress:22839}. Jean Stone also {gbtxprogress2:22951}.  Plan: The next appointment is scheduled for *** at ***, which will be via MyChart Video Visit. The next session will focus on {Plan for Next Appointment:23400}.

## 2021-12-27 ENCOUNTER — Ambulatory Visit (INDEPENDENT_AMBULATORY_CARE_PROVIDER_SITE_OTHER): Payer: BC Managed Care – PPO

## 2021-12-27 ENCOUNTER — Ambulatory Visit (HOSPITAL_BASED_OUTPATIENT_CLINIC_OR_DEPARTMENT_OTHER): Payer: BC Managed Care – PPO | Admitting: Orthopaedic Surgery

## 2021-12-27 DIAGNOSIS — M25571 Pain in right ankle and joints of right foot: Secondary | ICD-10-CM

## 2021-12-27 DIAGNOSIS — S93431A Sprain of tibiofibular ligament of right ankle, initial encounter: Secondary | ICD-10-CM | POA: Diagnosis not present

## 2021-12-31 ENCOUNTER — Telehealth (INDEPENDENT_AMBULATORY_CARE_PROVIDER_SITE_OTHER): Payer: BC Managed Care – PPO | Admitting: Psychology

## 2022-01-30 DIAGNOSIS — F908 Attention-deficit hyperactivity disorder, other type: Secondary | ICD-10-CM | POA: Diagnosis not present

## 2022-01-30 DIAGNOSIS — F3160 Bipolar disorder, current episode mixed, unspecified: Secondary | ICD-10-CM | POA: Diagnosis not present

## 2022-01-30 DIAGNOSIS — F603 Borderline personality disorder: Secondary | ICD-10-CM | POA: Diagnosis not present

## 2022-02-01 DIAGNOSIS — M795 Residual foreign body in soft tissue: Secondary | ICD-10-CM | POA: Diagnosis not present

## 2022-02-01 DIAGNOSIS — S91332A Puncture wound without foreign body, left foot, initial encounter: Secondary | ICD-10-CM | POA: Diagnosis not present

## 2022-02-11 ENCOUNTER — Telehealth (INDEPENDENT_AMBULATORY_CARE_PROVIDER_SITE_OTHER): Payer: Self-pay | Admitting: Psychology

## 2022-02-11 NOTE — Telephone Encounter (Signed)
  Office: [phone]  /  Fax: [phone]  Date of Call: February 11, 2022  Time of Call: 9:52am Provider: Lawerance Cruel, PsyD  CONTENT:  This provider called Jean Stone to check-in and schedule a follow-up appointment. A HIPAA compliant voicemail was left requesting a call back.   PLAN: No further follow-up planned by this provider.

## 2022-02-12 ENCOUNTER — Encounter (INDEPENDENT_AMBULATORY_CARE_PROVIDER_SITE_OTHER): Payer: Self-pay

## 2022-07-02 ENCOUNTER — Ambulatory Visit (HOSPITAL_BASED_OUTPATIENT_CLINIC_OR_DEPARTMENT_OTHER): Payer: BC Managed Care – PPO | Admitting: Orthopaedic Surgery

## 2022-08-04 DIAGNOSIS — F3130 Bipolar disorder, current episode depressed, mild or moderate severity, unspecified: Secondary | ICD-10-CM | POA: Diagnosis not present

## 2022-08-04 DIAGNOSIS — F4312 Post-traumatic stress disorder, chronic: Secondary | ICD-10-CM | POA: Diagnosis not present

## 2022-08-04 DIAGNOSIS — Z79899 Other long term (current) drug therapy: Secondary | ICD-10-CM | POA: Diagnosis not present

## 2022-08-04 DIAGNOSIS — F411 Generalized anxiety disorder: Secondary | ICD-10-CM | POA: Diagnosis not present

## 2022-08-04 DIAGNOSIS — F900 Attention-deficit hyperactivity disorder, predominantly inattentive type: Secondary | ICD-10-CM | POA: Diagnosis not present

## 2022-08-06 DIAGNOSIS — F411 Generalized anxiety disorder: Secondary | ICD-10-CM | POA: Diagnosis not present

## 2022-08-06 DIAGNOSIS — F4312 Post-traumatic stress disorder, chronic: Secondary | ICD-10-CM | POA: Diagnosis not present

## 2022-08-06 DIAGNOSIS — F3130 Bipolar disorder, current episode depressed, mild or moderate severity, unspecified: Secondary | ICD-10-CM | POA: Diagnosis not present

## 2022-08-06 DIAGNOSIS — F900 Attention-deficit hyperactivity disorder, predominantly inattentive type: Secondary | ICD-10-CM | POA: Diagnosis not present

## 2022-08-19 DIAGNOSIS — F900 Attention-deficit hyperactivity disorder, predominantly inattentive type: Secondary | ICD-10-CM | POA: Diagnosis not present

## 2022-08-19 DIAGNOSIS — F3130 Bipolar disorder, current episode depressed, mild or moderate severity, unspecified: Secondary | ICD-10-CM | POA: Diagnosis not present

## 2022-08-19 DIAGNOSIS — F411 Generalized anxiety disorder: Secondary | ICD-10-CM | POA: Diagnosis not present

## 2022-08-19 DIAGNOSIS — F4312 Post-traumatic stress disorder, chronic: Secondary | ICD-10-CM | POA: Diagnosis not present

## 2022-08-21 DIAGNOSIS — F3130 Bipolar disorder, current episode depressed, mild or moderate severity, unspecified: Secondary | ICD-10-CM | POA: Diagnosis not present

## 2022-08-21 DIAGNOSIS — F4312 Post-traumatic stress disorder, chronic: Secondary | ICD-10-CM | POA: Diagnosis not present

## 2022-08-21 DIAGNOSIS — F900 Attention-deficit hyperactivity disorder, predominantly inattentive type: Secondary | ICD-10-CM | POA: Diagnosis not present

## 2022-08-21 DIAGNOSIS — F411 Generalized anxiety disorder: Secondary | ICD-10-CM | POA: Diagnosis not present

## 2022-08-28 DIAGNOSIS — F3130 Bipolar disorder, current episode depressed, mild or moderate severity, unspecified: Secondary | ICD-10-CM | POA: Diagnosis not present

## 2022-08-28 DIAGNOSIS — F900 Attention-deficit hyperactivity disorder, predominantly inattentive type: Secondary | ICD-10-CM | POA: Diagnosis not present

## 2022-08-28 DIAGNOSIS — F4312 Post-traumatic stress disorder, chronic: Secondary | ICD-10-CM | POA: Diagnosis not present

## 2022-08-28 DIAGNOSIS — F411 Generalized anxiety disorder: Secondary | ICD-10-CM | POA: Diagnosis not present

## 2022-09-23 DIAGNOSIS — F900 Attention-deficit hyperactivity disorder, predominantly inattentive type: Secondary | ICD-10-CM | POA: Diagnosis not present

## 2022-09-23 DIAGNOSIS — F411 Generalized anxiety disorder: Secondary | ICD-10-CM | POA: Diagnosis not present

## 2022-09-23 DIAGNOSIS — F3130 Bipolar disorder, current episode depressed, mild or moderate severity, unspecified: Secondary | ICD-10-CM | POA: Diagnosis not present

## 2022-09-23 DIAGNOSIS — F4312 Post-traumatic stress disorder, chronic: Secondary | ICD-10-CM | POA: Diagnosis not present

## 2022-09-24 DIAGNOSIS — Z1151 Encounter for screening for human papillomavirus (HPV): Secondary | ICD-10-CM | POA: Diagnosis not present

## 2022-09-24 DIAGNOSIS — Z6841 Body Mass Index (BMI) 40.0 and over, adult: Secondary | ICD-10-CM | POA: Diagnosis not present

## 2022-09-24 DIAGNOSIS — Z01419 Encounter for gynecological examination (general) (routine) without abnormal findings: Secondary | ICD-10-CM | POA: Diagnosis not present

## 2022-09-24 DIAGNOSIS — Z113 Encounter for screening for infections with a predominantly sexual mode of transmission: Secondary | ICD-10-CM | POA: Diagnosis not present

## 2022-09-24 DIAGNOSIS — Z124 Encounter for screening for malignant neoplasm of cervix: Secondary | ICD-10-CM | POA: Diagnosis not present

## 2022-10-01 DIAGNOSIS — F411 Generalized anxiety disorder: Secondary | ICD-10-CM | POA: Diagnosis not present

## 2022-10-01 DIAGNOSIS — F4312 Post-traumatic stress disorder, chronic: Secondary | ICD-10-CM | POA: Diagnosis not present

## 2022-10-01 DIAGNOSIS — F900 Attention-deficit hyperactivity disorder, predominantly inattentive type: Secondary | ICD-10-CM | POA: Diagnosis not present

## 2022-10-01 DIAGNOSIS — F3130 Bipolar disorder, current episode depressed, mild or moderate severity, unspecified: Secondary | ICD-10-CM | POA: Diagnosis not present

## 2022-10-20 DIAGNOSIS — F900 Attention-deficit hyperactivity disorder, predominantly inattentive type: Secondary | ICD-10-CM | POA: Diagnosis not present

## 2022-10-20 DIAGNOSIS — F3130 Bipolar disorder, current episode depressed, mild or moderate severity, unspecified: Secondary | ICD-10-CM | POA: Diagnosis not present

## 2022-10-20 DIAGNOSIS — F4312 Post-traumatic stress disorder, chronic: Secondary | ICD-10-CM | POA: Diagnosis not present

## 2022-10-20 DIAGNOSIS — F411 Generalized anxiety disorder: Secondary | ICD-10-CM | POA: Diagnosis not present

## 2022-11-17 DIAGNOSIS — F411 Generalized anxiety disorder: Secondary | ICD-10-CM | POA: Diagnosis not present

## 2022-11-17 DIAGNOSIS — F4312 Post-traumatic stress disorder, chronic: Secondary | ICD-10-CM | POA: Diagnosis not present

## 2022-11-17 DIAGNOSIS — F3130 Bipolar disorder, current episode depressed, mild or moderate severity, unspecified: Secondary | ICD-10-CM | POA: Diagnosis not present

## 2022-11-17 DIAGNOSIS — F900 Attention-deficit hyperactivity disorder, predominantly inattentive type: Secondary | ICD-10-CM | POA: Diagnosis not present

## 2022-11-19 ENCOUNTER — Encounter: Payer: BC Managed Care – PPO | Admitting: Family Medicine

## 2022-12-05 DIAGNOSIS — L0100 Impetigo, unspecified: Secondary | ICD-10-CM | POA: Diagnosis not present

## 2022-12-05 DIAGNOSIS — L209 Atopic dermatitis, unspecified: Secondary | ICD-10-CM | POA: Diagnosis not present

## 2022-12-05 DIAGNOSIS — L739 Follicular disorder, unspecified: Secondary | ICD-10-CM | POA: Diagnosis not present

## 2022-12-17 ENCOUNTER — Encounter: Payer: BC Managed Care – PPO | Admitting: Family Medicine

## 2022-12-31 ENCOUNTER — Encounter: Payer: Self-pay | Admitting: Family Medicine

## 2022-12-31 ENCOUNTER — Ambulatory Visit (INDEPENDENT_AMBULATORY_CARE_PROVIDER_SITE_OTHER): Payer: BC Managed Care – PPO | Admitting: Family Medicine

## 2022-12-31 VITALS — BP 118/84 | HR 62 | Temp 98.6°F | Ht 65.5 in | Wt 273.3 lb

## 2022-12-31 DIAGNOSIS — G8929 Other chronic pain: Secondary | ICD-10-CM

## 2022-12-31 DIAGNOSIS — E049 Nontoxic goiter, unspecified: Secondary | ICD-10-CM

## 2022-12-31 DIAGNOSIS — E559 Vitamin D deficiency, unspecified: Secondary | ICD-10-CM

## 2022-12-31 DIAGNOSIS — M546 Pain in thoracic spine: Secondary | ICD-10-CM | POA: Diagnosis not present

## 2022-12-31 DIAGNOSIS — K625 Hemorrhage of anus and rectum: Secondary | ICD-10-CM

## 2022-12-31 DIAGNOSIS — M545 Low back pain, unspecified: Secondary | ICD-10-CM | POA: Diagnosis not present

## 2022-12-31 DIAGNOSIS — Z6841 Body Mass Index (BMI) 40.0 and over, adult: Secondary | ICD-10-CM | POA: Diagnosis not present

## 2022-12-31 DIAGNOSIS — Z0000 Encounter for general adult medical examination without abnormal findings: Secondary | ICD-10-CM | POA: Diagnosis not present

## 2022-12-31 DIAGNOSIS — N62 Hypertrophy of breast: Secondary | ICD-10-CM

## 2022-12-31 DIAGNOSIS — R1012 Left upper quadrant pain: Secondary | ICD-10-CM

## 2022-12-31 DIAGNOSIS — K644 Residual hemorrhoidal skin tags: Secondary | ICD-10-CM

## 2022-12-31 DIAGNOSIS — R635 Abnormal weight gain: Secondary | ICD-10-CM

## 2022-12-31 LAB — COMPREHENSIVE METABOLIC PANEL
ALT: 25 U/L (ref 0–35)
AST: 23 U/L (ref 0–37)
Albumin: 4 g/dL (ref 3.5–5.2)
Alkaline Phosphatase: 127 U/L — ABNORMAL HIGH (ref 39–117)
BUN: 11 mg/dL (ref 6–23)
CO2: 27 mEq/L (ref 19–32)
Calcium: 9.4 mg/dL (ref 8.4–10.5)
Chloride: 106 mEq/L (ref 96–112)
Creatinine, Ser: 0.73 mg/dL (ref 0.40–1.20)
GFR: 116.03 mL/min (ref >60.00)
Glucose, Bld: 74 mg/dL (ref 70–99)
Potassium: 4 mEq/L (ref 3.5–5.1)
Sodium: 138 mEq/L (ref 135–145)
Total Bilirubin: 0.6 mg/dL (ref 0.2–1.2)
Total Protein: 7.2 g/dL (ref 6.0–8.3)

## 2022-12-31 LAB — CBC WITH DIFFERENTIAL/PLATELET
Basophils Absolute: 0 10*3/uL (ref 0.0–0.1)
Basophils Relative: 0.3 % (ref 0.0–3.0)
Eosinophils Absolute: 0.2 10*3/uL (ref 0.0–0.7)
Eosinophils Relative: 2.5 % (ref 0.0–5.0)
HCT: 38.7 % (ref 36.0–46.0)
Hemoglobin: 12.5 g/dL (ref 12.0–15.0)
Lymphocytes Relative: 33.3 % (ref 12.0–46.0)
Lymphs Abs: 2.6 10*3/uL (ref 0.7–4.0)
MCHC: 32.4 g/dL (ref 30.0–36.0)
MCV: 83.6 fl (ref 78.0–100.0)
Monocytes Absolute: 0.4 10*3/uL (ref 0.1–1.0)
Monocytes Relative: 5.7 % (ref 3.0–12.0)
Neutro Abs: 4.6 10*3/uL (ref 1.4–7.7)
Neutrophils Relative %: 58.2 % (ref 43.0–77.0)
Platelets: 380 10*3/uL (ref 150.0–400.0)
RBC: 4.63 Mil/uL (ref 3.87–5.11)
RDW: 14.6 % (ref 11.5–15.5)
WBC: 7.9 10*3/uL (ref 4.0–10.5)

## 2022-12-31 LAB — LIPID PANEL
Cholesterol: 189 mg/dL (ref 0–200)
HDL: 51.5 mg/dL (ref >39.00)
LDL Cholesterol: 117 mg/dL — ABNORMAL HIGH (ref 0–99)
NonHDL: 137.84
Total CHOL/HDL Ratio: 4
Triglycerides: 103 mg/dL (ref 0.0–149.0)
VLDL: 20.6 mg/dL (ref 0.0–40.0)

## 2022-12-31 LAB — T4, FREE: Free T4: 0.62 ng/dL (ref 0.60–1.60)

## 2022-12-31 LAB — VITAMIN D 25 HYDROXY (VIT D DEFICIENCY, FRACTURES): VITD: 14.32 ng/mL — ABNORMAL LOW (ref 30.00–100.00)

## 2022-12-31 LAB — HEMOGLOBIN A1C: Hgb A1c MFr Bld: 5.3 % (ref 4.6–6.5)

## 2022-12-31 LAB — TSH: TSH: 1.42 u[IU]/mL (ref 0.35–5.50)

## 2022-12-31 NOTE — Progress Notes (Signed)
Established Patient Office Visit   Subjective  Patient ID: Jean Stone, female    DOB: [birth year]  Age: 22 y.o. MRN: [id number]  Chief Complaint  Patient presents with   Annual Exam    Back pain, allergy testing. Not a big eater but not losing wt, swims daily. Has noticed blood in stool for a few weeks, not always there but when she sees it it is a lot.   Pt accompanied by her fianc    Patient is a 22 year old female who is seen for CPE several ongoing concerns.  Patient endorses weight gain despite not eating much and exercising.  Patient walking and swimming daily.  Per fianc they are eating the same things and working out together.  Pt notes decreased appetite, may eat 1 meal per day.  States when he eats has LUQ abdominal pain/burning sensation in stomach.  Denies current NSAID use, states would take it almost daily a year ago for back pain.  Also notes early satiety, bloating, increased flatus.  Patient endorses chronic back pain.  Has pain all over her back.  First noticed low back.  While working at [employer] years ago.  Patient notes large breast cause pressure on chest making it difficult to breathe when she is laying down at night and hit her face.  Patient states she wears a size 40 J bra last time she was measured.  Has difficulty finding bras.  Typically wears sports bras.  Endorses back and shoulder pain.  Patient endorses BRBPR x 2 weeks.  May have 2-3 episodes per week states noticed blood in toilet and on toilet paper.  Denies constipation as may have BM 3-4 times per day.  Endorses some dizziness and fatigue.  Patient inquires about allergy testing for foods.  Denies any reactions.  Wondered if it would help her figure out what to avoid/what could be causing weight gain.  Patient states she had recent Pap.  Patient notes Lexapro recently increased as well as Vyvanse.  Patient and her fianc are planning to get married in November.    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 No pertinent surgical history. Social History   Tobacco Use   Smoking status: Never   Smokeless tobacco: Never  Substance Use Topics   Alcohol use: No    Alcohol/week: 0.0 standard drinks of alcohol   Drug use: No   Family History  Problem Relation Age of Onset   Hyperlipidemia Mother    Cancer Father    Depression Father    ADD / ADHD Sister    Diabetes Maternal Grandmother    Hypertension Maternal Grandfather    Allergies  Allergen Reactions   Other Other (See Comments)      ROS Negative unless stated above    Objective:     BP 118/84 (BP Location: Left Arm, Patient Position: Sitting, Cuff Size: Large)   Pulse 62   Temp 98.6 F (37 C) (Oral)   Ht 5' 5.5" (1.664 m)   Wt 273 lb 4.8 oz (  124 kg)   LMP 12/01/2022 (Approximate)   SpO2 96%   BMI 44.79 kg/m    Physical Exam Constitutional:      Appearance: Normal appearance.  HENT:     Head: Normocephalic and atraumatic.     Right Ear: Tympanic membrane, ear canal and external ear normal.     Left Ear: Tympanic membrane, ear canal and external ear normal.     Nose: Nose normal.     Mouth/Throat:     Mouth: Mucous membranes are moist.     Pharynx: No oropharyngeal exudate or posterior oropharyngeal erythema.  Eyes:     General: No scleral icterus.    Extraocular Movements: Extraocular movements intact.     Conjunctiva/sclera: Conjunctivae normal.     Pupils: Pupils are equal, round, and reactive to light.  Neck:     Thyroid: No thyromegaly.  Cardiovascular:     Rate and Rhythm: Normal rate and  regular rhythm.     Pulses: Normal pulses.     Heart sounds: Normal heart sounds. No murmur heard.    No friction rub.  Pulmonary:     Effort: Pulmonary effort is normal.     Breath sounds: Normal breath sounds. No wheezing, rhonchi or rales.  Chest:  Breasts:    Breasts are symmetrical.     Comments: Large pendulous breast Abdominal:     General: Bowel sounds are normal.     Palpations: Abdomen is soft.     Tenderness: There is no abdominal tenderness.  Genitourinary:    Rectum: External hemorrhoid present.     Comments: Small external hemorrhoidal skin tag.  No internal hemorrhoids appreciated.  Rectal tone normal.  No stool in rectal vault.  Guaiac negative. Musculoskeletal:        General: No deformity. Normal range of motion.  Lymphadenopathy:     Cervical: No cervical adenopathy.  Skin:    General: Skin is warm and dry.     Findings: No lesion.  Neurological:     General: No focal deficit present.     Mental Status: She is alert and oriented to person, place, and time.  Psychiatric:        Mood and Affect: Mood normal.        Thought Content: Thought content normal.      No results found for any visits on 12/31/22.    Assessment & Plan:  Well adult exam -Anticipatory guidance given including wearing seatbelts, smoke detectors in the home, increasing physical activity, increasing p.o. intake of water and vegetables. -Will obtain labs -Immunizations reviewed -Pap done per patient.  Will obtain information and update records. -Next CPE in 1 year  BRBPR (bright red blood per rectum) -Guaiac negative. -     Comprehensive metabolic panel -     CBC with Differential/Platelet -     Ambulatory referral to Gastroenterology  Chronic bilateral thoracic back pain -Likely 2/2 large pendulous breasts. -Discussed supportive care including wearing supportive bra, weight loss, working on posture. -     Ambulatory referral to Plastic Surgery  Chronic midline low back pain  without sciatica -Large breasts likely contributing to symptoms -     Ambulatory referral to Plastic Surgery  Weight gain -Body mass index is 44.79 kg/m. -Concern for thyroid dysfunction or medication contributing to weight gain. -Continue lifestyle modifications -Referral to weight management -     TSH -     T4, free -     Hemoglobin A1c -  Lipid panel -     Amb Ref to Medical Weight Management  Class 3 severe obesity with serious comorbidity and body mass index (BMI) of 40.0 to 44.9 in adult, unspecified obesity type (HCC) Body mass index is 44.79 kg/m. -     TSH -     T4, free -     Hemoglobin A1c -     Lipid panel -     Amb Ref to Medical Weight Management  Large breasts -Size 40 J bra -Given info to find supportive bras. -     Ambulatory referral to Plastic Surgery  Goiter -Stable per exam -Prior imaging normal -     TSH -     T4, free  Left upper quadrant abdominal pain -Discussed possible causes including ulcer, GERD.  Constipation less likely contributing given regular BMs. -     Comprehensive metabolic panel -     CBC with Differential/Platelet -     Ambulatory referral to Gastroenterology  Vitamin D deficiency -     VITAMIN D 25 Hydroxy (Vit-D Deficiency, Fractures)  Residual hemorrhoidal skin tags -Currently asymptomatic -OTC creams. -     Ambulatory referral to Gastroenterology   On day of service, 40 minutes spent caring for this patient face-to-face, reviewing the chart, counseling and/or coordinating care for plan and treatment of diagnosis below.    No follow-ups on file.   Deeann Saint, MD

## 2023-01-03 ENCOUNTER — Other Ambulatory Visit: Payer: Self-pay | Admitting: Family Medicine

## 2023-01-03 DIAGNOSIS — E559 Vitamin D deficiency, unspecified: Secondary | ICD-10-CM

## 2023-01-03 MED ORDER — VITAMIN D (ERGOCALCIFEROL) 1.25 MG (50000 UNIT) PO CAPS
50000.0000 [IU] | ORAL_CAPSULE | ORAL | 0 refills | Status: DC
Start: 2023-01-03 — End: 2024-03-23

## 2023-02-05 ENCOUNTER — Ambulatory Visit: Payer: BC Managed Care – PPO | Admitting: Plastic Surgery

## 2023-02-05 ENCOUNTER — Encounter: Payer: Self-pay | Admitting: Plastic Surgery

## 2023-02-05 VITALS — BP 116/82 | HR 77 | Resp 18 | Ht 65.0 in | Wt 263.0 lb

## 2023-02-05 DIAGNOSIS — N62 Hypertrophy of breast: Secondary | ICD-10-CM

## 2023-02-05 DIAGNOSIS — Z6841 Body Mass Index (BMI) 40.0 and over, adult: Secondary | ICD-10-CM

## 2023-02-05 DIAGNOSIS — M546 Pain in thoracic spine: Secondary | ICD-10-CM

## 2023-02-05 DIAGNOSIS — M542 Cervicalgia: Secondary | ICD-10-CM | POA: Diagnosis not present

## 2023-02-05 NOTE — Progress Notes (Signed)
Referring Provider Deeann Saint, MD [address] [place],  [place] [address]   CC:  Chief Complaint  Patient presents with   Consult      Jean Stone is an 22 y.o. female.  HPI: Ms. Kuzmich is a 22 year old female who presents today with complaints of upper back and neck pain which she attributes to the large size of her breast.  She states that she has been having back pain since she began breast development in the sixth grade.  And it is just gotten worse over the years.  She is interested in a breast reduction.  Allergies  Allergen Reactions   Other Other (See Comments)    Outpatient Encounter Medications as of 02/05/2023  Medication Sig   Vitamin D, Ergocalciferol, (DRISDOL) 1.25 MG (50000 UNIT) CAPS capsule Take 1 capsule (50,000 Units total) by mouth every 7 (seven) days.   escitalopram (LEXAPRO) 10 MG tablet Take 10 mg by mouth daily.   lamoTRIgine (LAMICTAL) 100 MG tablet Take 100 mg by mouth daily.   levonorgestrel-ethinyl estradiol (SEASONALE) 0.15-0.03 MG tablet Take 1 tablet by mouth daily. (Patient not taking: Reported on 12/31/2022)   VYVANSE 20 MG capsule Take 30 mg by mouth daily.   No facility-administered encounter medications on file as of 02/05/2023.     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  Family History  Problem Relation Age of Onset   Hyperlipidemia Mother    Cancer Father    Depression Father    ADD / ADHD Sister    Diabetes Maternal Grandmother    Hypertension Maternal Grandfather     Social History   Social History Narrative    Work or School: [employer] Situation: lives with mother, sister and brother      Spiritual Beliefs: Baha'i      Lifestyle: no regular exercise, does PE at school; diet is healthy           Review of Systems General: Denies fevers, chills, weight loss CV: Denies chest pain, shortness of breath, palpitations Breast: Large pendulous breasts which the patient feels are contributing to her upper back and neck pain.  She denies any other specific breast complaints.  Physical Exam    02/05/2023    2:40 PM 12/31/2022   11:40 AM 11/21/2021    7:00 AM  Vitals with BMI  Height 5\' 5"  5' 5.5" 5\' 6"   Weight 263 lbs 273 lbs 5 oz 266 lbs  BMI 43.77 44.77 42.95  Systolic 116 118 191  Diastolic 82 84 75  Pulse 77 62 81    General:  No acute distress,  Alert and oriented, Non-Toxic, Normal speech and affect Breast: Patient has very large pendulous breast with grade 3 ptosis.  She does not have any dominant masses and the nipples are normal in appearance without evidence of nipple discharge. Mammogram: Not applicable due to age Assessment/Plan Macromastia: I have discussed breast reductions at length with the patient including the  location of the incisions and the unpredictable nature of scarring.  We discussed the risks of bleeding, infection, and seroma formation.  She understands I will use drains postoperatively to help decrease the risk of seroma formation.  The patient and I discussed the risk of nipple loss due to nipple ischemia as well as the possibility of a free nipple graft if there is a recognized problem in the operating room.  We discussed the fact that approximately 10% of women will not be able to successfully breast-feed after a breast reduction.  The patient stated that both her grandmother and her mother were unable to produce enough milk to breast-feed and she does not anticipate that she will be able to breast-feed either. The patient is currently a BMI of 44.  I have told her  that I would prefer if she were a BMI of 40 or less before we proceed with surgery.  This would be a 20 pound weight loss for her which I believe is easily doable.  I will refer her to the healthy weight and wellness program.  She does have [facility] so she will be referred for physical therapy.  Once she is finished with her physical therapy and achieved her weight loss and is at a BMI of 40 she may return for measurements pictures and scheduling.  I believe that I will be able to remove between 900 and 1000 g per breast.  Santiago Glad 02/05/2023, 3:11 PM

## 2023-02-19 ENCOUNTER — Other Ambulatory Visit: Payer: Self-pay

## 2023-02-19 ENCOUNTER — Ambulatory Visit: Payer: BC Managed Care – PPO | Attending: Plastic Surgery

## 2023-02-19 DIAGNOSIS — R252 Cramp and spasm: Secondary | ICD-10-CM | POA: Insufficient documentation

## 2023-02-19 DIAGNOSIS — N62 Hypertrophy of breast: Secondary | ICD-10-CM | POA: Insufficient documentation

## 2023-02-19 DIAGNOSIS — M546 Pain in thoracic spine: Secondary | ICD-10-CM | POA: Diagnosis not present

## 2023-02-19 DIAGNOSIS — R293 Abnormal posture: Secondary | ICD-10-CM | POA: Insufficient documentation

## 2023-02-19 DIAGNOSIS — M6281 Muscle weakness (generalized): Secondary | ICD-10-CM | POA: Diagnosis not present

## 2023-02-19 NOTE — Therapy (Signed)
OUTPATIENT PHYSICAL THERAPY CERVICAL EVALUATION   Patient Name: Jean Stone MRN: [id number] DOB:02/01/2001, 22 y.o., female Today's Date: 02/20/2023  END OF SESSION:  PT End of Session - 02/19/23 1622     Visit Number 1    Date for PT Re-Evaluation 04/16/23    Authorization Type [employer]    PT Start Time 1615    PT Stop Time 1649    PT Time Calculation (min) 34 min             Past Medical History:  Diagnosis Date   Abnormal bleeding in menstrual cycle    ADHD (attention deficit hyperactivity disorder)    ADHD (attention deficit hyperactivity disorder), combined type 10/02/2015   Anxiety    Back pain    Constipation    Depression    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 No pertinent surgical history. 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    PCP: Deeann Saint, MD  REFERRING PROVIDER: Santiago Glad, MD  REFERRING DIAG: N62 (ICD-10-CM) - Macromastia  THERAPY DIAG:  Pain in thoracic spine - Plan: PT plan of care cert/re-cert  Abnormal posture - Plan: PT plan of care cert/re-cert  Cramp and spasm - Plan: PT plan of care cert/re-cert  Muscle weakness (generalized) - Plan: PT plan of care cert/re-cert  Rationale for Evaluation and Treatment: Rehabilitation  ONSET DATE: 02/05/2023  SUBJECTIVE:                                                                                                                                                                                                          SUBJECTIVE STATEMENT: Patient states she was middle school age when she first started having upper back pain.  She developed extremely large breasts at an early age and has been dealing with back and neck pain since she was in high school.  She also experiences headaches at times.  She is an aesthetician doing wax treatments all day where she is leaning forward for long periods of time.  She has seen MD  about breast reduction but is required to do a trial of PT to help with postural strength and pain.  Pain 4/10 currently but increases with standing for work.  Her goal is to gain strength and learn pain management while she awaits qualification for surgery.   Hand dominance: Right  PERTINENT HISTORY:  na  PAIN:  Are you having pain?  4/10  PRECAUTIONS: None  RED FLAGS: None     WEIGHT BEARING RESTRICTIONS: No  FALLS:  Has patient fallen in last 6 months? No  LIVING ENVIRONMENT: Lives with: lives with their family Lives in: House/apartment  OCCUPATION: aesthetician  PLOF: Independent, Independent with basic ADLs, Independent with household mobility without device, Independent with community mobility without device, Independent with homemaking with ambulation, Independent with gait, and Independent with transfers  PATIENT GOALS: Her goal is to gain strength and learn pain management while she awaits qualification for surgery.    NEXT MD VISIT: 6 weeks  OBJECTIVE:   DIAGNOSTIC FINDINGS:  na  PATIENT SURVEYS:  NDI 64%  COGNITION: Overall cognitive status: Within functional limits for tasks assessed  SENSATION: WFL  POSTURE: rounded shoulders, forward head, and increased thoracic kyphosis  PALPATION: Taut bands and trigger points bilateral parascapular areas,  bilateral upper traps and cervical spine, rhomboids   CERVICAL ROM:   WNL  UPPER EXTREMITY ROM:   UPPER EXTREMITY MMT:   CERVICAL SPECIAL TESTS:  Spurling's test: Negative   TODAY'S  TREATMENT:                                                                                                                              DATE: 02/19/23   PATIENT EDUCATION:  Education details: Initiated HEP, educated on importance of posture Person educated: Patient Education method: Programmer, multimedia, Facilities manager, Verbal cues, and Handouts Education comprehension: verbalized understanding, returned demonstration, and verbal cues required  HOME EXERCISE PROGRAM: Access Code: [email or web address] URL: [email or web address] Date: 02/20/2023 Prepared by: Mikey Kirschner  Exercises - Scapular Retraction with Resistance Advanced  - 2 x daily - 7 x weekly - 1 sets - 20 reps - Scapular Retraction with Resistance  - 2 x daily - 7 x weekly - 1 sets - 20 reps - Shoulder External Rotation and Scapular Retraction with Resistance  - 2 x daily - 7 x weekly - 1 sets - 20 reps - Standing Shoulder Horizontal Abduction with Resistance  - 1 x daily - 7 x weekly - 1 sets - 20 reps  ASSESSMENT:  CLINICAL IMPRESSION: Patient is a 22 y.o. female who was seen today for physical therapy evaluation and treatment for thoracic and neck pain due to macromastia.   OBJECTIVE IMPAIRMENTS: decreased strength, increased fascial restrictions, increased muscle spasms, postural dysfunction, and pain.   ACTIVITY LIMITATIONS: carrying, lifting, bending, sitting, standing, squatting, sleeping, transfers, bed mobility, bathing, dressing, reach over head, and hygiene/grooming  PARTICIPATION LIMITATIONS: meal prep, cleaning, laundry, driving, shopping, community activity, occupation, and yard work  PERSONAL FACTORS:  Fitness and 1-2 comorbidities: ADHD and anxiety/depression  are also affecting patient's functional outcome.   REHAB POTENTIAL: Good  CLINICAL DECISION MAKING: Stable/uncomplicated  EVALUATION COMPLEXITY: Low   GOALS: Goals reviewed with patient? Yes  SHORT TERM GOALS: Target date: 03/19/2023   Pain  report to be no greater than 4/10  Baseline:  Goal status: INITIAL  2.  Patient will be independent with initial HEP  Baseline:  Goal status: INITIAL  Goal status: INITIAL  LONG TERM GOALS: Target date: 04/16/2023   Patient to be independent with advanced HEP  Baseline:  Goal status: INITIAL  2.  Patient to report pain no greater than 2/10  Baseline:  Goal status: INITIAL  3.  Patient will be able to reach overhead into cabinets and on top of shelves without pain  Baseline:  Goal status: INITIAL  4.  Patient to be able to sleep through the night  Baseline:  Goal status: INITIAL  5.  Patient to be qualified to undergo breast reduction to avoid further postural issues as she ages Baseline:  Goal status: INITIAL  PLAN:  PT FREQUENCY: 1x/week  PT DURATION: 8 weeks  PLANNED INTERVENTIONS: Therapeutic exercises, Therapeutic activity, Neuromuscular re-education, Patient/Family education, Self Care, Joint mobilization, Aquatic Therapy, Dry Needling, Electrical stimulation, Spinal mobilization, Cryotherapy, Moist heat, Taping, Traction, Ultrasound, Ionotophoresis 4mg /ml Dexamethasone, Manual therapy, and Re-evaluation  PLAN FOR NEXT SESSION: UBE, review HEP   Mitsuye Schrodt B. Dalonda Simoni, PT 02/20/23 11:09 AM [facility] [address] [place], [place] [address] Phone # [phone] Fax [phone]

## 2023-02-24 DIAGNOSIS — K582 Mixed irritable bowel syndrome: Secondary | ICD-10-CM | POA: Diagnosis not present

## 2023-02-24 DIAGNOSIS — R112 Nausea with vomiting, unspecified: Secondary | ICD-10-CM | POA: Diagnosis not present

## 2023-02-24 DIAGNOSIS — R140 Abdominal distension (gaseous): Secondary | ICD-10-CM | POA: Diagnosis not present

## 2023-02-26 ENCOUNTER — Ambulatory Visit: Payer: BC Managed Care – PPO

## 2023-02-26 DIAGNOSIS — M6281 Muscle weakness (generalized): Secondary | ICD-10-CM

## 2023-02-26 DIAGNOSIS — R252 Cramp and spasm: Secondary | ICD-10-CM | POA: Diagnosis not present

## 2023-02-26 DIAGNOSIS — R293 Abnormal posture: Secondary | ICD-10-CM

## 2023-02-26 DIAGNOSIS — N62 Hypertrophy of breast: Secondary | ICD-10-CM | POA: Diagnosis not present

## 2023-02-26 DIAGNOSIS — M546 Pain in thoracic spine: Secondary | ICD-10-CM

## 2023-02-26 NOTE — Therapy (Signed)
OUTPATIENT PHYSICAL THERAPY CERVICAL TREATMENT   Patient Name: Jean Stone MRN: [id number] DOB:10/05/2000, 22 y.o., female Today's Date: 02/26/2023  END OF SESSION:  PT End of Session - 02/26/23 1615     Visit Number 2    Date for PT Re-Evaluation 04/16/23    Authorization Type [employer]    PT Start Time 1615    PT Stop Time 1650    PT Time Calculation (min) 35 min    Activity Tolerance Patient tolerated treatment well    Behavior During Therapy WFL for tasks assessed/performed             Past Medical History:  Diagnosis Date   Abnormal bleeding in menstrual cycle    ADHD (attention deficit hyperactivity disorder)    ADHD (attention deficit hyperactivity disorder), combined type 10/02/2015   Anxiety    Back pain    Constipation    Depression    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 No pertinent surgical history. 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    PCP: Deeann Saint, MD  REFERRING PROVIDER: Santiago Glad, MD  REFERRING DIAG: N62 (ICD-10-CM) - Macromastia  THERAPY DIAG:  Pain in thoracic spine  Abnormal posture  Cramp and spasm  Muscle weakness (generalized)  Rationale for Evaluation and Treatment: Rehabilitation  ONSET DATE: 02/05/2023  SUBJECTIVE:                                                                                                                                                                                                         SUBJECTIVE  STATEMENT: Patient reports she is doing ok today.  She states her upper back pain is average but her low back is hurting today.    Hand dominance: Right  PERTINENT HISTORY:  na  PAIN:  Are you having pain?  4/10  PRECAUTIONS: None  RED FLAGS: None     WEIGHT BEARING RESTRICTIONS: No  FALLS:  Has patient fallen in last 6 months? No  LIVING ENVIRONMENT: Lives with: lives with their family Lives in: House/apartment  OCCUPATION: aesthetician  PLOF: Independent, Independent with basic ADLs, Independent with household mobility without device, Independent with community mobility without device, Independent with homemaking with ambulation, Independent with gait, and Independent with transfers  PATIENT GOALS: Her goal is to gain strength and learn pain management while she awaits qualification for surgery.    NEXT MD VISIT: 6 weeks  OBJECTIVE:   DIAGNOSTIC FINDINGS:  na  PATIENT SURVEYS:  NDI 64%  COGNITION: Overall cognitive status: Within functional limits for tasks assessed  SENSATION: WFL  POSTURE: rounded shoulders, forward head, and increased thoracic kyphosis  PALPATION: Taut bands and trigger points bilateral parascapular areas,  bilateral upper traps and cervical spine, rhomboids   CERVICAL ROM:   WNL  UPPER EXTREMITY ROM:   UPPER EXTREMITY MMT:   CERVICAL SPECIAL TESTS:  Spurling's test: Negative   TODAY'S TREATMENT:                                                                                                                              DATE: 02/26/23 Nustep (UBE occupied)  x 5 min  Reviewed HEP Prone over physio ball "superwoman" Trigger Point Dry-Needling  Treatment instructions: Expect mild to moderate muscle soreness. S/S of pneumothorax if dry needled over a lung field, and to seek immediate medical attention should they occur. Patient verbalized understanding of these instructions and education. Patient Consent Given: Yes Education  handout provided: Yes Muscles treated: bilateral upper traps, cervical multifidi, sub occipitals Electrical stimulation performed: No Parameters: N/A Treatment response/outcome: Skilled palpation used to identify taut bands and trigger points.  Once identified, dry needling techniques used to treat these areas.  Twitch response ellicited along with palpable elongation of muscle.  Following treatment, patient reported feeling "my muscles feel so relaxed and my neck pain is gone"   DATE: 02/19/23  Initial eval completed and initiated HEP  PATIENT EDUCATION:  Education details: Initiated HEP, educated on importance of posture Person educated: Patient Education method: Programmer, multimedia, Facilities manager, Verbal cues, and Handouts Education comprehension: verbalized understanding, returned demonstration, and verbal cues required  HOME EXERCISE PROGRAM: Access Code: [email or web address] URL: [email or web address] Date: 02/20/2023 Prepared by: Mikey Kirschner  Exercises - Scapular Retraction with Resistance Advanced  - 2 x daily - 7 x weekly - 1 sets - 20 reps - Scapular Retraction with Resistance  - 2 x daily - 7 x weekly - 1 sets - 20 reps - Shoulder External Rotation and Scapular Retraction with Resistance  - 2 x daily - 7 x weekly - 1 sets - 20 reps - Standing Shoulder Horizontal Abduction with Resistance  - 1 x daily - 7 x weekly - 1 sets - 20 reps  ASSESSMENT:  CLINICAL IMPRESSION: Patient was able to demonstrate HEP with moderate corrections and verbal cues.  She had good twitch responses throughout all areas that we dry needled today.  She reported significant relief of symptoms at end of session.  She would benefit from continuing skilled PT for  postural strengthening, core stabilization and manual techniques to gain control of her pain symptoms and allow her to do routine daily activities, including her job.    OBJECTIVE IMPAIRMENTS: decreased strength, increased fascial restrictions,  increased muscle spasms, postural dysfunction, and pain.   ACTIVITY LIMITATIONS: carrying, lifting, bending, sitting, standing, squatting, sleeping, transfers, bed mobility, bathing, dressing, reach over head, and hygiene/grooming  PARTICIPATION LIMITATIONS: meal prep, cleaning, laundry, driving, shopping, community activity, occupation, and yard work  PERSONAL FACTORS: Fitness and 1-2 comorbidities: ADHD and anxiety/depression  are also affecting patient's functional outcome.   REHAB POTENTIAL: Good  CLINICAL DECISION MAKING: Stable/uncomplicated  EVALUATION COMPLEXITY: Low   GOALS: Goals reviewed with patient? Yes  SHORT TERM GOALS: Target date: 03/19/2023   Pain report to be no greater than 4/10  Baseline:  Goal status: INITIAL  2.  Patient will be independent with initial HEP  Baseline:  Goal status: INITIAL  Goal status: INITIAL  LONG TERM GOALS: Target date: 04/16/2023   Patient to be independent with advanced HEP  Baseline:  Goal status: INITIAL  2.  Patient to report pain no greater than 2/10  Baseline:  Goal status: INITIAL  3.  Patient will be able to reach overhead into cabinets and on top of shelves without pain  Baseline:  Goal status: INITIAL  4.  Patient to be able to sleep through the night  Baseline:  Goal status: INITIAL  5.  Patient to be qualified to undergo breast reduction to avoid further postural issues as she ages Baseline:  Goal status: INITIAL  PLAN:  PT FREQUENCY: 1x/week  PT DURATION: 8 weeks  PLANNED INTERVENTIONS: Therapeutic exercises, Therapeutic activity, Neuromuscular re-education, Patient/Family education, Self Care, Joint mobilization, Aquatic Therapy, Dry Needling, Electrical stimulation, Spinal mobilization, Cryotherapy, Moist heat, Taping, Traction, Ultrasound, Ionotophoresis 4mg /ml Dexamethasone, Manual therapy, and Re-evaluation  PLAN FOR NEXT SESSION: Assess response to DN 1st session, progress postural  strengthening, DN 2 if patient has good response.  Heat or ice if indicated.     Victorino Dike B. Kilee Hedding, PT 02/26/23 4:59 PM  [facility] [address] [place], [place] [address] Phone # [phone] Fax [phone]

## 2023-03-04 ENCOUNTER — Ambulatory Visit: Payer: BC Managed Care – PPO

## 2023-03-11 ENCOUNTER — Ambulatory Visit: Payer: BC Managed Care – PPO | Attending: Plastic Surgery

## 2023-03-11 DIAGNOSIS — M6281 Muscle weakness (generalized): Secondary | ICD-10-CM | POA: Diagnosis not present

## 2023-03-11 DIAGNOSIS — R252 Cramp and spasm: Secondary | ICD-10-CM

## 2023-03-11 DIAGNOSIS — M546 Pain in thoracic spine: Secondary | ICD-10-CM | POA: Diagnosis not present

## 2023-03-11 DIAGNOSIS — R293 Abnormal posture: Secondary | ICD-10-CM | POA: Diagnosis not present

## 2023-03-11 NOTE — Therapy (Signed)
OUTPATIENT PHYSICAL THERAPY CERVICAL TREATMENT   Patient Name: Jean Stone MRN: [id number] DOB:[birth year], 22 y.o., female Today's Date: 03/11/2023  END OF SESSION:  PT End of Session - 03/11/23 0849     Visit Number 3    Date for PT Re-Evaluation 04/16/23    Authorization Type [employer]    PT Start Time [phone]    PT Stop Time 0915    PT Time Calculation (min) 28 min    Activity Tolerance Patient tolerated treatment well    Behavior During Therapy [facility] for tasks assessed/performed             Past Medical History:  Diagnosis Date   Abnormal bleeding in menstrual cycle    ADHD (attention deficit hyperactivity disorder)    ADHD (attention deficit hyperactivity disorder), combined type 10/02/2015   Anxiety    Back pain    Constipation    Depression    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 No pertinent surgical history. 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    PCP: Deeann Saint, MD  REFERRING PROVIDER: Santiago Glad, MD  REFERRING DIAG: N62 (ICD-10-CM) - Macromastia  THERAPY DIAG:  Pain in thoracic spine  Abnormal posture  Cramp and spasm  Muscle weakness (generalized)  Rationale for Evaluation and Treatment: Rehabilitation  ONSET DATE: 02/05/2023  SUBJECTIVE:                                                                                                                                                                                                         SUBJECTIVE  STATEMENT: Patient reports she is doing ok.  She states that the dry needling "made me sore but in a good way".  Still having pain with long day at work due to the heaviness of her chest.      Hand dominance: Right  PERTINENT HISTORY:  na  PAIN:  Are you having pain?  4/10  PRECAUTIONS: None  RED FLAGS: None     WEIGHT BEARING RESTRICTIONS: No  FALLS:  Has patient fallen in  last 6 months? No  LIVING ENVIRONMENT: Lives with: lives with their family Lives in: House/apartment  OCCUPATION: aesthetician  PLOF: Independent, Independent with basic ADLs, Independent with household mobility without device, Independent with community mobility without device, Independent with homemaking with ambulation, Independent with gait, and Independent with transfers  PATIENT GOALS: Her goal is to gain strength and learn pain management while she awaits qualification for surgery.    NEXT MD VISIT: 6 weeks  OBJECTIVE:   DIAGNOSTIC FINDINGS:  na  PATIENT SURVEYS:  NDI 64%  COGNITION: Overall cognitive status: Within functional limits for tasks assessed  SENSATION: WFL  POSTURE: rounded shoulders, forward head, and increased thoracic kyphosis  PALPATION: Taut bands and trigger points bilateral parascapular areas,  bilateral upper traps and cervical spine, rhomboids   CERVICAL ROM:   WNL  UPPER EXTREMITY ROM:   UPPER EXTREMITY MMT:   CERVICAL SPECIAL TESTS:  Spurling's test: Negative   TODAY'S TREATMENT:                                                                                                                              DATE: 03/11/23 UBE x 6 min  3/3  Tband shoulder ext and row with green band with handles x 20 each Tband shoulder ER and horizontal abd x 20 each with green band 3 way scapular stabilization with blue loop x 10 each side 4 D ball rolls with light blue plyo ball x 20 each direction on each UE Doorway stretch for anterior chest and pec lengthening x 5  holding 10 sec each Thoracic stretch over back of chair x 5 holding 10  Trigger Point Dry-Needling  Treatment instructions: Expect mild to moderate muscle soreness. S/S of pneumothorax if dry needled over a lung field, and to seek immediate medical attention should they occur. Patient verbalized understanding of these instructions and education. Patient Consent Given: Yes Education handout provided: Yes Muscles treated: bilateral upper traps, cervical multifidi, sub occipitals Electrical stimulation performed: No Parameters: N/A Treatment response/outcome: Skilled palpation used to identify taut bands and trigger points.  Once identified, dry needling techniques used to treat these areas.  Twitch response ellicited along with palpable elongation of muscle.  Following treatment, patient reported feeling "good".     DATE: 02/26/23 Nustep (UBE occupied)  x 5 min  Reviewed HEP Prone over physio ball "superwoman" Trigger Point Dry-Needling  Treatment instructions: Expect mild to moderate muscle soreness. S/S of pneumothorax if dry needled over a lung field, and to seek immediate medical attention should they occur. Patient verbalized understanding of these instructions and education. Patient Consent Given: Yes Education handout provided: Yes Muscles treated: bilateral upper traps, cervical multifidi, sub occipitals Electrical stimulation performed: No Parameters: N/A Treatment response/outcome: Skilled palpation used to identify taut bands and trigger points.  Once identified, dry needling techniques used to treat these areas.  Twitch response ellicited along with palpable elongation of muscle.  Following treatment, patient reported feeling "my muscles  feel so relaxed and my neck pain is gone"   DATE: 02/19/23  Initial eval completed and initiated HEP  PATIENT EDUCATION:  Education details: Initiated HEP, educated on importance of posture Person educated: Patient Education method: Programmer, multimedia,  Facilities manager, Verbal cues, and Handouts Education comprehension: verbalized understanding, returned demonstration, and verbal cues required  HOME EXERCISE PROGRAM: Access Code: [email or web address] URL: [email or web address] Date: 02/20/2023 Prepared by: Mikey Kirschner  Exercises - Scapular Retraction with Resistance Advanced  - 2 x daily - 7 x weekly - 1 sets - 20 reps - Scapular Retraction with Resistance  - 2 x daily - 7 x weekly - 1 sets - 20 reps - Shoulder External Rotation and Scapular Retraction with Resistance  - 2 x daily - 7 x weekly - 1 sets - 20 reps - Standing Shoulder Horizontal Abduction with Resistance  - 1 x daily - 7 x weekly - 1 sets - 20 reps  ASSESSMENT:  CLINICAL IMPRESSION: Annamaria is progressing appropriately.  She is tolerating increased resistance and higher level postural strengthening without increase in pain.  She continues to feel pain in the thoracic and low back with prolonged positions at work.  She continues to feel the heaviness of her chest keeps causes her to have to take more postural rest breaks, which interferes with her work.    She had good twitch responses throughout all areas that we dry needled today.  She reported relief of symptoms at end of session.  She would benefit from continuing skilled PT for postural strengthening, core stabilization and manual techniques to gain control of her pain symptoms and allow her to do routine daily activities, including her job.    OBJECTIVE IMPAIRMENTS: decreased strength, increased fascial restrictions, increased muscle spasms, postural dysfunction, and pain.   ACTIVITY LIMITATIONS: carrying, lifting, bending, sitting, standing, squatting, sleeping, transfers, bed mobility, bathing, dressing, reach over head, and hygiene/grooming  PARTICIPATION LIMITATIONS: meal prep, cleaning, laundry, driving, shopping, community activity, occupation, and yard work  PERSONAL FACTORS: Fitness and 1-2 comorbidities:  ADHD and anxiety/depression  are also affecting patient's functional outcome.   REHAB POTENTIAL: Good  CLINICAL DECISION MAKING: Stable/uncomplicated  EVALUATION COMPLEXITY: Low   GOALS: Goals reviewed with patient? Yes  SHORT TERM GOALS: Target date: 03/19/2023   Pain report to be no greater than 4/10  Baseline:  Goal status: Progressing  2.  Patient will be independent with initial HEP  Baseline:  Goal status: MET   LONG TERM GOALS: Target date: 04/16/2023   Patient to be independent with advanced HEP  Baseline:  Goal status: Progressing  2.  Patient to report pain no greater than 2/10  Baseline:  Goal status: Progressing  3.  Patient will be able to reach overhead into cabinets and on top of shelves without pain  Baseline:  Goal status: Progressing  4.  Patient to be able to sleep through the night  Baseline:  Goal status: Progressing (sleeping about an hour longer)  5.  Patient to be qualified to undergo breast reduction to avoid further postural issues as she ages Baseline:  Goal status: INITIAL  PLAN:  PT FREQUENCY: 1x/week  PT DURATION: 8 weeks  PLANNED INTERVENTIONS: Therapeutic exercises, Therapeutic activity, Neuromuscular re-education, Patient/Family education, Self Care, Joint mobilization, Aquatic Therapy, Dry Needling, Electrical stimulation, Spinal mobilization, Cryotherapy, Moist heat, Taping, Traction, Ultrasound, Ionotophoresis 4mg /ml Dexamethasone, Manual therapy, and Re-evaluation  PLAN FOR NEXT SESSION: Assess response to DN 2nd session, continue to progress postural strengthening, DN 3 if patient has good response.  Heat or ice if indicated.     Victorino Dike B. Alandra Sando, PT 03/11/23 9:22 AM [facility] [address] [place], [place] [address] Phone # [phone] Fax [phone]

## 2023-03-17 DIAGNOSIS — F411 Generalized anxiety disorder: Secondary | ICD-10-CM | POA: Diagnosis not present

## 2023-03-17 DIAGNOSIS — F3130 Bipolar disorder, current episode depressed, mild or moderate severity, unspecified: Secondary | ICD-10-CM | POA: Diagnosis not present

## 2023-03-17 DIAGNOSIS — F4312 Post-traumatic stress disorder, chronic: Secondary | ICD-10-CM | POA: Diagnosis not present

## 2023-03-17 DIAGNOSIS — Z5181 Encounter for therapeutic drug level monitoring: Secondary | ICD-10-CM | POA: Diagnosis not present

## 2023-03-17 DIAGNOSIS — F900 Attention-deficit hyperactivity disorder, predominantly inattentive type: Secondary | ICD-10-CM | POA: Diagnosis not present

## 2023-03-18 ENCOUNTER — Ambulatory Visit: Payer: BC Managed Care – PPO

## 2023-03-18 DIAGNOSIS — M546 Pain in thoracic spine: Secondary | ICD-10-CM

## 2023-03-18 DIAGNOSIS — M6281 Muscle weakness (generalized): Secondary | ICD-10-CM

## 2023-03-18 DIAGNOSIS — R293 Abnormal posture: Secondary | ICD-10-CM

## 2023-03-18 DIAGNOSIS — R252 Cramp and spasm: Secondary | ICD-10-CM

## 2023-03-18 NOTE — Therapy (Signed)
OUTPATIENT PHYSICAL THERAPY CERVICAL TREATMENT   Patient Name: Jean Stone MRN: [id number] DOB:2000-09-22, 22 y.o., female Today's Date: 03/18/2023  END OF SESSION:  PT End of Session - 03/18/23 1539     Visit Number 4    Date for PT Re-Evaluation 04/16/23    Authorization Type BL[employer]    PT Start Time 1530    PT Stop Time 1605    PT Time Calculation (min) 35 min    Activity Tolerance Patient tolerated treatment well    Behavior During Therapy WFL for tasks assessed/performed             Past Medical History:  Diagnosis Date   Abnormal bleeding in menstrual cycle    ADHD (attention deficit hyperactivity disorder)    ADHD (attention deficit hyperactivity disorder), combined type 10/02/2015   Anxiety    Back pain    Constipation    Depression    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 No pertinent surgical history. 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  Pain level 6/10.   Still struggles with doing her job but the DN is helping with the muscle aches and tight feeling.      Hand dominance: Right  PERTINENT HISTORY:  na  PAIN:  Are you having pain?  6/10  PRECAUTIONS: None  RED FLAGS: None     WEIGHT BEARING RESTRICTIONS: No  FALLS:  Has patient fallen in last 6 months? No  LIVING ENVIRONMENT: Lives  with: lives with their family Lives in: House/apartment  OCCUPATION: aesthetician  PLOF: Independent, Independent with basic ADLs, Independent with household mobility without device, Independent with community mobility without device, Independent with homemaking with ambulation, Independent with gait, and Independent with transfers  PATIENT GOALS: Her goal is to gain strength and learn pain management while she awaits qualification for surgery.    NEXT MD VISIT: 6 weeks  OBJECTIVE:   DIAGNOSTIC FINDINGS:  na  PATIENT SURVEYS:  NDI 64%  COGNITION: Overall cognitive status: Within functional limits for tasks assessed  SENSATION: WFL  POSTURE: rounded shoulders, forward head, and increased thoracic kyphosis  PALPATION: Taut bands and trigger points bilateral parascapular areas,  bilateral upper traps and cervical spine, rhomboids   CERVICAL ROM:   WNL  UPPER EXTREMITY ROM:   UPPER EXTREMITY MMT:   CERVICAL SPECIAL TESTS:  Spurling's test: Negative   TODAY'S TREATMENT:                                                                                                                              DATE: 03/18/23 UBE x 6 min  3/3  Tband shoulder ext and row with blue band with handles x 20 each Tband shoulder ER and horizontal abd x 20 each with green band 3 way scapular stabilization with blue loop x 10 each side 4 D ball rolls with light blue plyo ball x 20 each direction on each UE Doorway stretch for anterior chest and pec lengthening x 5 holding 10 sec  each Thoracic stretch over foam roller x 5 holding 10 sec Trigger Point Dry-Needling  Treatment instructions: Expect mild to moderate muscle soreness. S/S of pneumothorax if dry needled over a lung field, and to seek immediate medical attention should they occur. Patient verbalized understanding of these instructions and education. Patient Consent Given: Yes Education handout provided: Yes Muscles treated: bilateral upper traps, cervical multifidi, sub occipitals bilaterally Electrical stimulation performed: No Parameters: N/A Treatment response/outcome: Skilled palpation used to identify taut bands and trigger points.  Once identified, dry needling techniques used to treat these areas.  Twitch response ellicited along with palpable elongation of muscle.  Following treatment, patient reported "it feels so weirdly relaxing".     DATE: 03/11/23 UBE x 6 min  3/3  Tband shoulder ext and row with green band with handles x 20 each Tband shoulder ER and horizontal abd x 20 each with green band 3 way scapular stabilization with blue loop x 10 each side 4 D ball rolls with light blue plyo ball x 20 each direction on each UE Doorway stretch for anterior chest and pec lengthening x 5 holding 10 sec each Thoracic stretch over back of chair x 5 holding 10  Trigger Point Dry-Needling  Treatment instructions: Expect mild to moderate muscle soreness. S/S of pneumothorax if dry needled over a lung field, and to seek immediate medical attention should they occur. Patient verbalized understanding of these instructions and education.  Patient Consent Given: Yes Education handout provided: Yes Muscles treated: bilateral upper traps, cervical multifidi, sub occipitals Electrical stimulation performed: No Parameters: N/A Treatment response/outcome: Skilled palpation used to identify taut bands and trigger points.  Once identified, dry needling techniques used to treat these areas.  Twitch response ellicited along  with palpable elongation of muscle.  Following treatment, patient reported feeling "good".      DATE: 02/19/23  Initial eval completed and initiated HEP  PATIENT EDUCATION:  Education details: Initiated HEP, educated on importance of posture Person educated: Patient Education method: Programmer, multimedia, Facilities manager, Verbal cues, and Handouts Education comprehension: verbalized understanding, returned demonstration, and verbal cues required  HOME EXERCISE PROGRAM: Access Code: [email or web address] URL: [email or web address] Date: 02/20/2023 Prepared by: Mikey Kirschner  Exercises - Scapular Retraction with Resistance Advanced  - 2 x daily - 7 x weekly - 1 sets - 20 reps - Scapular Retraction with Resistance  - 2 x daily - 7 x weekly - 1 sets - 20 reps - Shoulder External Rotation and Scapular Retraction with Resistance  - 2 x daily - 7 x weekly - 1 sets - 20 reps - Standing Shoulder Horizontal Abduction with Resistance  - 1 x daily - 7 x weekly - 1 sets - 20 reps  ASSESSMENT:  CLINICAL IMPRESSION: Kessie is making steady progress considering her macromastia and the heaviness of her chest.  She was able to tolerate increased resistance on all postural exercises today.  We detected several twitch responses during DN today.  She is compliant and well motivated.   She would benefit from continuing skilled PT for postural strengthening, core stabilization and manual techniques to gain control of her pain symptoms and allow her to do routine daily activities, including her job.    OBJECTIVE IMPAIRMENTS: decreased strength, increased fascial restrictions, increased muscle spasms, postural dysfunction, and pain.   ACTIVITY LIMITATIONS: carrying, lifting, bending, sitting, standing, squatting, sleeping, transfers, bed mobility, bathing, dressing, reach over head, and hygiene/grooming  PARTICIPATION LIMITATIONS: meal prep, cleaning, laundry, driving, shopping, community activity, occupation, and  yard work  PERSONAL FACTORS: Fitness and 1-2 comorbidities: ADHD and anxiety/depression  are also affecting patient's functional outcome.   REHAB POTENTIAL: Good  CLINICAL DECISION MAKING: Stable/uncomplicated  EVALUATION COMPLEXITY: Low   GOALS: Goals reviewed with patient? Yes  SHORT TERM GOALS: Target date: 03/19/2023   Pain report to be no greater than 4/10  Baseline:  Goal status: Progressing  2.  Patient will be independent with initial HEP  Baseline:  Goal status: MET   LONG TERM GOALS: Target date: 04/16/2023   Patient to be independent with advanced HEP  Baseline:  Goal status: Progressing  2.  Patient to report pain no greater than 2/10  Baseline:  Goal status: Progressing  3.  Patient will be able to reach overhead into cabinets and on top of shelves without pain  Baseline:  Goal status: Progressing  4.  Patient to be able to sleep through the night  Baseline:  Goal status: Progressing (sleeping about an hour longer)  5.  Patient to be qualified to undergo breast reduction to avoid further postural issues as she ages Baseline:  Goal status: INITIAL  PLAN:  PT FREQUENCY: 1x/week  PT DURATION: 8 weeks  PLANNED INTERVENTIONS: Therapeutic exercises, Therapeutic activity, Neuromuscular re-education, Patient/Family education, Self Care, Joint mobilization, Aquatic Therapy, Dry Needling, Electrical stimulation, Spinal mobilization, Cryotherapy, Moist heat, Taping, Traction, Ultrasound, Ionotophoresis 4mg /ml Dexamethasone, Manual therapy, and Re-evaluation  PLAN FOR NEXT SESSION: Continue to progress postural strengthening  and scapular stabilization, DN 4 if patient has good response.  Heat or ice if indicated.     Victorino Dike B. Nelly Scriven, PT 03/18/23 4:07 PM  [facility] [address] [place], [place] [address] Phone # [phone] Fax [phone]

## 2023-03-25 ENCOUNTER — Ambulatory Visit: Payer: BC Managed Care – PPO

## 2023-03-28 ENCOUNTER — Other Ambulatory Visit: Payer: Self-pay | Admitting: Family Medicine

## 2023-03-28 DIAGNOSIS — E559 Vitamin D deficiency, unspecified: Secondary | ICD-10-CM

## 2023-03-31 DIAGNOSIS — F411 Generalized anxiety disorder: Secondary | ICD-10-CM | POA: Diagnosis not present

## 2023-03-31 DIAGNOSIS — F3130 Bipolar disorder, current episode depressed, mild or moderate severity, unspecified: Secondary | ICD-10-CM | POA: Diagnosis not present

## 2023-04-01 ENCOUNTER — Ambulatory Visit: Payer: BC Managed Care – PPO

## 2023-04-14 ENCOUNTER — Ambulatory Visit: Payer: BC Managed Care – PPO | Attending: Plastic Surgery

## 2023-04-14 DIAGNOSIS — R252 Cramp and spasm: Secondary | ICD-10-CM | POA: Insufficient documentation

## 2023-04-14 DIAGNOSIS — R293 Abnormal posture: Secondary | ICD-10-CM | POA: Insufficient documentation

## 2023-04-14 DIAGNOSIS — M546 Pain in thoracic spine: Secondary | ICD-10-CM | POA: Diagnosis not present

## 2023-04-14 DIAGNOSIS — M6281 Muscle weakness (generalized): Secondary | ICD-10-CM | POA: Diagnosis not present

## 2023-04-14 NOTE — Therapy (Signed)
OUTPATIENT PHYSICAL THERAPY CERVICAL TREATMENT   Patient Name: Jean Stone MRN: [id number] DOB:05/25/2001, 22 y.o., female Today's Date: 04/14/2023  END OF SESSION:  PT End of Session - 04/14/23 1627     Visit Number 5    Date for PT Re-Evaluation 04/16/23    Authorization Type BL[employer]    PT Start Time 1615    PT Stop Time 1650    PT Time Calculation (min) 35 min    Activity Tolerance Patient tolerated treatment well    Behavior During Therapy WFL for tasks assessed/performed             Past Medical History:  Diagnosis Date   Abnormal bleeding in menstrual cycle    ADHD (attention deficit hyperactivity disorder)    ADHD (attention deficit hyperactivity disorder), combined type 10/02/2015   Anxiety    Back pain    Constipation    Depression    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 No pertinent surgical history. 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  Pain level 6/10.   Still struggles with doing her job but the DN is helping with the muscle aches and tight feeling.      Hand dominance: Right  PERTINENT HISTORY:  na  PAIN:  Are you having pain?  6/10  PRECAUTIONS: None  RED FLAGS: None     WEIGHT BEARING RESTRICTIONS: No  FALLS:  Has patient fallen in last 6 months? No  LIVING ENVIRONMENT: Lives  with: lives with their family Lives in: House/apartment  OCCUPATION: aesthetician  PLOF: Independent, Independent with basic ADLs, Independent with household mobility without device, Independent with community mobility without device, Independent with homemaking with ambulation, Independent with gait, and Independent with transfers  PATIENT GOALS: Her goal is to gain strength and learn pain management while she awaits qualification for surgery.    NEXT MD VISIT: 6 weeks  OBJECTIVE:   DIAGNOSTIC FINDINGS:  na  PATIENT SURVEYS:  NDI 64%  COGNITION: Overall cognitive status: Within functional limits for tasks assessed  SENSATION: WFL  POSTURE: rounded shoulders, forward head, and increased thoracic kyphosis  PALPATION: Taut bands and trigger points bilateral parascapular areas,  bilateral upper traps and cervical spine, rhomboids   CERVICAL ROM:   WNL  UPPER EXTREMITY ROM:   UPPER EXTREMITY MMT:   CERVICAL SPECIAL TESTS:  Spurling's test: Negative   TODAY'S TREATMENT:                                                                                                                              DATE: 04/14/23 UBE x 6 min  3/3  3 way scapular stabilization with blue loop x 10 each side 4 D ball rolls with light blue plyo ball x 20 each direction on each UE Lat pull down x 20 with 40 lbs Matrix row with 30 lbs x 20 Trigger Point Dry-Needling  Treatment instructions: Expect mild to moderate muscle soreness. S/S of pneumothorax if dry needled over a lung field,  and to seek immediate medical attention should they occur. Patient verbalized understanding of these instructions and education. Patient Consent Given: Yes Education handout provided: Yes Muscles treated: bilateral upper traps, cervical multifidi, sub occipitals bilaterally Electrical stimulation performed: No Parameters: N/A Treatment response/outcome: Skilled palpation used to identify taut bands and trigger points.  Once identified, dry needling techniques used to treat these areas.  Twitch response ellicited along with palpable elongation of muscle.  Following treatment, patient reported "I feel great"   DATE: 03/18/23 UBE x 6 min  3/3  Tband shoulder ext and row with blue band with handles x 20 each Tband shoulder ER and horizontal abd x 20 each with green band 3 way scapular stabilization with blue loop x 10 each side 4 D ball rolls with light blue plyo ball x 20 each direction on each UE Doorway stretch for anterior chest and pec lengthening x 5 holding 10 sec each Thoracic stretch over foam roller x 5 holding 10 sec Trigger Point Dry-Needling  Treatment instructions: Expect mild to moderate muscle soreness. S/S of pneumothorax if dry needled over a lung field, and to seek immediate medical attention should they occur. Patient verbalized understanding of these instructions and education. Patient Consent Given: Yes Education handout provided: Yes Muscles treated: bilateral upper traps, cervical multifidi, sub occipitals bilaterally Electrical stimulation performed: No Parameters: N/A Treatment response/outcome: Skilled palpation used to identify taut bands and trigger points.  Once identified,  dry needling techniques used to treat these areas.  Twitch response ellicited along with palpable elongation of muscle.  Following treatment, patient reported "it feels so weirdly relaxing".     DATE: 03/11/23 UBE x 6 min  3/3  Tband shoulder ext and row with green band with handles x 20  each Tband shoulder ER and horizontal abd x 20 each with green band 3 way scapular stabilization with blue loop x 10 each side 4 D ball rolls with light blue plyo ball x 20 each direction on each UE Doorway stretch for anterior chest and pec lengthening x 5 holding 10 sec each Thoracic stretch over back of chair x 5 holding 10  Trigger Point Dry-Needling  Treatment instructions: Expect mild to moderate muscle soreness. S/S of pneumothorax if dry needled over a lung field, and to seek immediate medical attention should they occur. Patient verbalized understanding of these instructions and education. Patient Consent Given: Yes Education handout provided: Yes Muscles treated: bilateral upper traps, cervical multifidi, sub occipitals Electrical stimulation performed: No Parameters: N/A Treatment response/outcome: Skilled palpation used to identify taut bands and trigger points.  Once identified, dry needling techniques used to treat these areas.  Twitch response ellicited along with palpable elongation of muscle.  Following treatment, patient reported feeling "good".      DATE: 02/19/23  Initial eval completed and initiated HEP  PATIENT EDUCATION:  Education details: Initiated HEP, educated on importance of posture Person educated: Patient Education method: Programmer, multimedia, Facilities manager, Verbal cues, and Handouts Education comprehension: verbalized understanding, returned demonstration, and verbal cues required  HOME EXERCISE PROGRAM: Access Code: [email or web address] URL: [email or web address] Date: 02/20/2023 Prepared by: Mikey Kirschner  Exercises - Scapular Retraction with Resistance Advanced  - 2 x daily - 7 x weekly - 1 sets - 20 reps - Scapular Retraction with Resistance  - 2 x daily - 7 x weekly - 1 sets - 20 reps - Shoulder External Rotation and Scapular Retraction with Resistance  - 2 x daily - 7 x weekly - 1 sets - 20 reps - Standing Shoulder Horizontal Abduction with  Resistance  - 1 x daily - 7 x weekly - 1 sets - 20 reps  ASSESSMENT:  CLINICAL IMPRESSION: Equilla is progressing appropriately but does have return of symptoms with doing her usual work activities.  We have discussed options for positioning  She is compliant and well motivated.   She would benefit from continuing skilled PT for postural strengthening, core stabilization and manual techniques to gain control of her pain symptoms and allow her to do routine daily activities, including her job.    OBJECTIVE IMPAIRMENTS: decreased strength, increased fascial restrictions, increased muscle spasms, postural dysfunction, and pain.   ACTIVITY LIMITATIONS: carrying, lifting, bending, sitting, standing, squatting, sleeping, transfers, bed mobility, bathing, dressing, reach over head, and hygiene/grooming  PARTICIPATION LIMITATIONS: meal prep, cleaning, laundry, driving, shopping, community activity, occupation, and yard work  PERSONAL FACTORS: Fitness and 1-2 comorbidities: ADHD and anxiety/depression  are also affecting patient's functional outcome.   REHAB POTENTIAL: Good  CLINICAL DECISION MAKING: Stable/uncomplicated  EVALUATION COMPLEXITY: Low   GOALS: Goals reviewed with patient? Yes  SHORT TERM GOALS: Target date: 03/19/2023   Pain report to be no greater than 4/10  Baseline:  Goal status: Progressing  2.  Patient will be independent with initial HEP  Baseline:  Goal status: MET   LONG TERM GOALS: Target date: 04/16/2023   Patient to be independent with advanced HEP  Baseline:  Goal status: MET 04/14/23  2.  Patient to report pain no greater than 2/10  Baseline:  Goal status: Progressing  3.  Patient will be able to reach overhead into cabinets and on top of shelves without pain  Baseline:  Goal status: MET 04/14/23  4.  Patient to be able to sleep through the night  Baseline:  Goal status: MET 04/14/23  5.  Patient to be qualified to undergo breast reduction to  avoid further postural issues as she ages Baseline:  Goal status: INITIAL  PLAN:  PT FREQUENCY: 1x/week  PT DURATION: 8 weeks  PLANNED INTERVENTIONS: Therapeutic exercises, Therapeutic activity, Neuromuscular re-education, Patient/Family education, Self Care, Joint mobilization, Aquatic Therapy, Dry Needling, Electrical stimulation, Spinal mobilization, Cryotherapy, Moist heat, Taping, Traction, Ultrasound, Ionotophoresis 4mg /ml Dexamethasone, Manual therapy, and Re-evaluation  PLAN FOR NEXT SESSION: Continue to progress postural strengthening and scapular stabilization along with DN.  Heat or ice if indicated.     Victorino Dike B. Masin Shatto, PT 04/14/23 8:50 PM [facility] [address], [address] [place], [place] [address] Phone # [phone] Fax [phone]

## 2023-04-22 ENCOUNTER — Ambulatory Visit: Payer: BC Managed Care – PPO

## 2023-04-26 DIAGNOSIS — H66002 Acute suppurative otitis media without spontaneous rupture of ear drum, left ear: Secondary | ICD-10-CM | POA: Diagnosis not present

## 2023-04-29 DIAGNOSIS — F3130 Bipolar disorder, current episode depressed, mild or moderate severity, unspecified: Secondary | ICD-10-CM | POA: Diagnosis not present

## 2023-06-03 DIAGNOSIS — F3130 Bipolar disorder, current episode depressed, mild or moderate severity, unspecified: Secondary | ICD-10-CM | POA: Diagnosis not present

## 2023-06-23 ENCOUNTER — Telehealth: Payer: Self-pay | Admitting: Plastic Surgery

## 2023-06-23 DIAGNOSIS — G8929 Other chronic pain: Secondary | ICD-10-CM

## 2023-06-23 DIAGNOSIS — N62 Hypertrophy of breast: Secondary | ICD-10-CM

## 2023-06-23 DIAGNOSIS — M542 Cervicalgia: Secondary | ICD-10-CM

## 2023-06-23 NOTE — Telephone Encounter (Signed)
Patient called and says she has been going to PT for last several moths. She says she's been for 5 visits and has one more. She says she tried to sch appointment and was told that she needs a new referral.

## 2023-06-24 NOTE — Telephone Encounter (Signed)
Referral placed.

## 2023-07-03 DIAGNOSIS — R1030 Lower abdominal pain, unspecified: Secondary | ICD-10-CM | POA: Diagnosis not present

## 2023-07-16 ENCOUNTER — Ambulatory Visit: Payer: BC Managed Care – PPO | Attending: Surgical | Admitting: Physical Therapy

## 2023-07-16 ENCOUNTER — Encounter: Payer: Self-pay | Admitting: Physical Therapy

## 2023-07-16 DIAGNOSIS — G8929 Other chronic pain: Secondary | ICD-10-CM | POA: Insufficient documentation

## 2023-07-16 DIAGNOSIS — M6281 Muscle weakness (generalized): Secondary | ICD-10-CM | POA: Diagnosis present

## 2023-07-16 DIAGNOSIS — M546 Pain in thoracic spine: Secondary | ICD-10-CM

## 2023-07-16 DIAGNOSIS — R293 Abnormal posture: Secondary | ICD-10-CM | POA: Diagnosis present

## 2023-07-16 DIAGNOSIS — M542 Cervicalgia: Secondary | ICD-10-CM | POA: Insufficient documentation

## 2023-07-16 DIAGNOSIS — N62 Hypertrophy of breast: Secondary | ICD-10-CM | POA: Diagnosis not present

## 2023-07-16 DIAGNOSIS — R252 Cramp and spasm: Secondary | ICD-10-CM

## 2023-07-16 NOTE — Therapy (Addendum)
 OUTPATIENT PHYSICAL THERAPY CERVICAL TREATMENT / RE-CERTIFICATION / ERO/ DISCHARGE NOTE   Patient Name: Jean Stone MRN: [id number] DOB:[birth year], 23 y.o., female Today's Date: 07/16/2023  END OF SESSION:  PT End of Session - 07/16/23 1830     Visit Number 6    Date for PT Re-Evaluation 08/14/23    Authorization Type BCBS    PT Start Time 1537    PT Stop Time 1612    PT Time Calculation (min) 35 min    Activity Tolerance Patient tolerated treatment well    Behavior During Therapy WFL for tasks assessed/performed              Past Medical History:  Diagnosis Date   Abnormal bleeding in menstrual cycle    ADHD (attention deficit hyperactivity disorder)    ADHD (attention deficit hyperactivity disorder), combined type 10/02/2015   Anxiety    Back pain    Constipation    Depression    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 No pertinent surgical history. 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 Her pain is mainly in the middle of her back. She has been doing her exercise at home.      Hand dominance: Right  PERTINENT HISTORY:  na  PAIN: 07/16/2023 Are you having pain?  8/10  PRECAUTIONS: None  RED FLAGS: None     WEIGHT BEARING RESTRICTIONS: No  FALLS:  Has patient fallen in last 6 months? No  LIVING ENVIRONMENT: Lives  with: lives with their family Lives in: House/apartment  OCCUPATION: aesthetician  PLOF: Independent, Independent with basic ADLs, Independent with household mobility without device, Independent with community mobility without device, Independent with homemaking with ambulation, Independent with gait, and Independent with transfers  PATIENT GOALS: Her goal is to gain strength and learn pain management while she awaits qualification for surgery.    NEXT MD VISIT: 6 weeks  OBJECTIVE:   DIAGNOSTIC FINDINGS:  na  PATIENT SURVEYS:  NDI 64%   07/16/2023 44% COGNITION: Overall cognitive status: Within functional limits for tasks assessed  SENSATION: WFL  POSTURE: rounded shoulders, forward head, and increased thoracic kyphosis  PALPATION: Taut bands and trigger points bilateral parascapular areas,  bilateral upper traps and cervical spine, rhomboids   CERVICAL ROM:   WNL  UPPER EXTREMITY ROM:   UPPER EXTREMITY MMT:   CERVICAL SPECIAL TESTS:  Spurling's test: Negative   TODAY'S TREATMENT:                                                                                                                              DATE:  07/16/2023 Reassessment & discussion on how things have been and things to work on NDI: 44% Tband shoulder ext and row with blue band with handles x 20 each Tband shoulder ER and horizontal abd x 20 each with green band x 20 Education on post op handout and exercises to perform post surgery Cat Cow x 5 Child's Pose x 30 secs Thread the Needle x5  each direction   04/14/23 UBE x 6 min  3/3  3 way scapular stabilization with blue loop x 10 each side 4 D ball rolls with light blue plyo ball x 20 each direction on each UE Lat pull down x 20 with 40 lbs Matrix row with 30 lbs x 20 Trigger Point Dry-Needling  Treatment instructions: Expect mild to moderate muscle soreness. S/S of pneumothorax if dry needled over a lung field, and to seek immediate medical attention should they occur. Patient verbalized understanding of these instructions and education. Patient Consent Given: Yes Education handout provided: Yes Muscles treated: bilateral upper traps, cervical multifidi, sub occipitals bilaterally Electrical stimulation performed: No Parameters: N/A Treatment response/outcome: Skilled palpation used to identify taut bands and trigger points.  Once identified, dry needling techniques used to treat these areas.  Twitch response ellicited along with palpable elongation of muscle.  Following treatment, patient reported I feel great   DATE: 03/18/23 UBE x 6 min  3/3  Tband shoulder ext and row with blue band with handles x 20 each Tband shoulder ER and horizontal abd x 20 each with green band 3 way scapular stabilization with blue loop x 10 each side 4 D ball rolls with light blue plyo ball x 20 each direction on each UE Doorway stretch for anterior chest and pec lengthening x 5 holding 10 sec each Thoracic stretch over foam roller x 5 holding 10 sec Trigger Point Dry-Needling  Treatment instructions: Expect mild to moderate muscle soreness. S/S of pneumothorax if dry needled over a lung field, and to seek immediate medical attention should they occur. Patient verbalized understanding of these instructions and education. Patient Consent Given: Yes Education handout provided: Yes Muscles treated: bilateral upper traps, cervical multifidi, sub occipitals bilaterally Electrical stimulation performed: No Parameters: N/A Treatment  response/outcome: Skilled palpation used to identify taut bands and trigger points.  Once identified, dry needling techniques used to treat these areas.  Twitch response ellicited along with palpable elongation of muscle.  Following treatment, patient reported it feels so weirdly relaxing.       PATIENT EDUCATION:  Education details: Initiated HEP, educated on importance of posture Person educated: Patient Education method: Programmer, Multimedia, Facilities Manager, Verbal cues, and Handouts Education comprehension: verbalized understanding, returned demonstration, and verbal cues required  HOME EXERCISE PROGRAM: Access Code: [email or web address] URL: [email or web address] Date: 07/16/2023 Prepared by: Kristeen Sar  Exercises - Scapular Retraction with Resistance Advanced  - 2 x daily - 7 x weekly - 1 sets - 20 reps - Scapular Retraction with Resistance  - 2 x daily - 7 x weekly - 1 sets - 20 reps - Shoulder External Rotation and Scapular Retraction with Resistance  - 2 x daily - 7 x weekly - 1 sets - 20 reps - Standing Shoulder Horizontal Abduction with Resistance  - 1 x daily - 7 x weekly - 1 sets - 20 reps - Cat Cow  - 1 x daily - 7 x weekly - 1 sets - 10 reps - Child's Pose with Sidebending  - 1 x daily - 7 x weekly - 2 sets - 20-30 hold - Quadruped Thoracic Rotation - Reach Under  - 1 x daily - 7 x weekly - 10 reps  ASSESSMENT:  CLINICAL IMPRESSION: Completed ERO today. There has been a lapse in treatment due to the holidays and outside factors. Patient verbalized that  pain levels are still the same and it is increased with her work. Discussed positions patient can use to keep proper postural mechanics, however it is very limited because the tables at her job do not adjust. Based on NDI score patient is better functionally. She has partially met her goals at this time. She still experienced high pain levels due to the weight of her breast tissue. Updated patient's HEP to include thoracic  mobility and flexibility exercises. Reviewed post- op breast reduction handout with patient and provided exercise she can perform after surgery. Will place patient on a 30 day hold to observe symptoms management.    Completed ERO today. It  OBJECTIVE IMPAIRMENTS: decreased strength, increased fascial restrictions, increased muscle spasms, postural dysfunction, and pain.   ACTIVITY LIMITATIONS: carrying, lifting, bending, sitting, standing, squatting, sleeping, transfers, bed mobility, bathing, dressing, reach over head, and hygiene/grooming  PARTICIPATION LIMITATIONS: meal prep, cleaning, laundry, driving, shopping, community activity, occupation, and yard work  PERSONAL FACTORS: Fitness and 1-2 comorbidities: ADHD and anxiety/depression  are also affecting patient's functional outcome.   REHAB POTENTIAL: Good  CLINICAL DECISION MAKING: Stable/uncomplicated  EVALUATION COMPLEXITY: Low   GOALS: Goals reviewed with patient? Yes  SHORT TERM GOALS: Target date: 03/19/2023   Pain report to be no greater than 4/10  Baseline:  Goal status: NOT MET 07/16/2023  2.  Patient will be independent with initial HEP  Baseline:  Goal status: MET   LONG TERM GOALS: Target date: 08/14/2023   Patient to be independent with advanced HEP  Baseline:  Goal status: MET 04/14/23  2.  Patient to report pain no greater than 2/10  Baseline:  Goal status: NOT MET 07/16/2023  3.  Patient will be able to reach overhead into cabinets and on top of shelves without pain  Baseline:  Goal status: MET 04/14/23  4.  Patient to be able to sleep through the night  Baseline:  Goal status: MET 04/14/23  5.  Patient to be qualified to undergo breast reduction to avoid further postural issues as she ages Baseline:  Goal status: IN PROGRESS 07/16/2023 (Patient needs to schedule appt with MD)  PLAN:  PT FREQUENCY: 1x/week  PT DURATION: 8 weeks  PLANNED INTERVENTIONS: Therapeutic exercises, Therapeutic activity,  Neuromuscular re-education, Patient/Family education, Self Care, Joint mobilization, Aquatic Therapy, Dry Needling, Electrical stimulation, Spinal mobilization, Cryotherapy, Moist heat, Taping, Traction, Ultrasound, Ionotophoresis 4mg /ml Dexamethasone, Manual therapy, and Re-evaluation  PLAN FOR NEXT SESSION: will place patient on a 30 hold and d/c if not hear from by 08/14/2023   Kristeen Sar, PT 07/16/23 6:32 PM [facility] [address], [address] [place], [place] [address] Phone # [phone] Fax [phone]   PHYSICAL THERAPY DISCHARGE SUMMARY  Visits from Start of Care: 7  Current functional level related to goals / functional outcomes: See above   Remaining deficits: See above   Education / Equipment: See above   Patient agrees to discharge. Patient goals were partially met. Patient is being discharged due to not returning since the last visit.

## 2023-07-28 ENCOUNTER — Telehealth: Payer: Self-pay

## 2023-07-28 NOTE — Telephone Encounter (Signed)
LVM to contact us to switch 07/30/23 appt to an APP

## 2023-07-30 ENCOUNTER — Ambulatory Visit: Payer: BC Managed Care – PPO | Admitting: Surgical

## 2023-07-30 ENCOUNTER — Institutional Professional Consult (permissible substitution): Payer: BC Managed Care – PPO | Admitting: Plastic Surgery

## 2024-01-18 NOTE — H&P (Signed)
 ------------------------------------------------------------------------------- Attestation signed by April Jacob Pinal, MD at 01/18/2024 12:40 PM I reviewed the information provided and agree with the treatment plan documented.  April Jacob Pinal, MD   -------------------------------------------------------------------------------  History & Physical   Service: Obstetrics  Chief Complaint: cramping, DFM  Clinic: [facility]  Diagnoses: Intrauterine pregnancy Anemia BPD/GAD/MDD Obesity  Gestational Age: [redacted]w[redacted]d Dating Criteria: LMP   Assessment & Plan    Jean Stone is a 23 y.o. G2P0010 being treated for:    #Cramping/Pelvic pain - Endorses cramping/pelvic pain that  - Good FM. Denies LOF, VB.  - SVE 0/0/-4 on admission - NST reactive  - Toco: quiet  - Urine dip overall reassuring - Triage interventions: tylenol /flexeril, pain improved from 9/10 to 6/10 - GC/CT and wet prep collected Assessment: Low suspicion for labor at this time. Suspect MSK related pain.  Plan - Discharge to home with return precautions  - Next appointment 7/16 - Flexeril Rx provided, precautions reviewed   #Decreased FM - VS WNL - Endorses decreased fetal movement since last night - Denies LOF, VB - NST reactive  - MVP 5.2 cm Assessment: Overall reassuring fetal status.  PLAN - Discharge to home with return precautions  #Fetal Wellbeing  -Fetal Monitoring: Single NST (see separate procedure note) -Steroids: N/A -NICU Consult: N/A -Group B Streptococcus: Not collected -Presentation: Cephalic by bedside ultrasound   #Maternal Considerations -Blood Type: O Positive   Disposition: Home in stable condition  Subjective    Jean Stone presents with cramping since 0500. Reports the cramping is located in her lower abdomen/right pelvic area and is constant. Pain currently is a 9/10. Also reports vaginal pain/pressure. Denies abnormal vaginal discharge or  itching/irritation. Denies dysuria. Last BM yesterday, reports daily BMs.  Reports DFM since yesterday. Has felt some movement in triage.    OB Review of Systems: Fetal movement: Decreased Vaginal bleeding: No Loss of fluid: No Contractions: patient cannot tell if her pain is contractions.   Review of Systems: A complete review of systems was performed with pertinent positives as above.   Objective   Temp:  [97.8 F (36.6 C)] 97.8 F (36.6 C) Heart Rate:  [100] 100 Resp:  [17] 17 BP: (108)/(64) 108/64   Physical Exam: GENERAL: Alert, No acute distress CHEST: No increased work of breathing CV: Regular rate ABDOMEN: Gravid, soft, nontender EXTREMITIES:  Warm and well-perfused, nontender, nonedematous NEURO: CN II-XII grossly intact SVE: 0/ 0/ -4 SSE: normal e  Obstetric ultrasounds reviewed in Epic.  Lab results and other imaging reviewed in Epic.  History   OB History  Gravida Para Term Preterm AB Living  2    1   SAB IAB Ectopic Molar Multiple Live Births  1         # Outcome Date GA Lbr Len/2nd Weight Sex Type Anes PTL Lv  2 Current           1 SAB 06/2023     Complete       Medical History[1]  Surgical History[2]  Coding for today's visit was reached by Medical Decision Making (MDM).  Sharyne Dunks, MD PGY-2 Obstetrics & Gynecology 01/18/24 9:42 AM           [1] Past Medical History: Diagnosis Date  . ADD (attention deficit disorder)   . Anemia    mild anemia as a teenager  . Anxiety   . Bipolar 1 disorder    (CMD)   . Depression   . Suicide attempt    (  CMD)    2015 and 2019  [2] No past surgical history on file.

## 2024-01-20 NOTE — Progress Notes (Signed)
 RETURN PRENATAL VISIT Chief Complaint: Jean Stone is a 23 y.o. G2P0010 at [redacted]w[redacted]d with an EDD of 03/29/2024, Date entered prior to episode creation here for a routine return prenatal visit.   She has no complaints. She denies vaginal bleeding, leakage of fluid, or contractions.  Reports normal fetal movement.  She denies shortness of breath, chest pain, worsening peripheral edema, nausea/vomiting, right upper quadrant abdominal pain, blurry or spotty vision or severe or persistent headache Reports heaviness in pelvis and low back.  Exam: BP 118/68   Pulse 85   Wt 134 kg (295 lb 3.2 oz)   LMP 06/23/2023   BMI 47.65 kg/m  See OB Tools  Assessment/Plan: 24 y.o. G2P0010 at [redacted]w[redacted]d here for a return prenatal visit. See problem list overviews for comprehensive pregnancy plans for each problem. The following were addressed today:  Low back pain during pregnancy in third trimester (CMD) Has PT today. Reviewed stretching and comfort measures.   Pelvic floor tension PT today.  [redacted] weeks gestation of pregnancy (CMD) Doing well. Discussed measures for comfort while working, seat for most of the day. US  7/2 EFW 48%.  Routine precautions, fetal movement expectations, labor S&S, and parameters for triage eval reviewed.  ROB in 2 weeks, growth US  scheduled.    Follow up: Return in about 2 weeks (around 02/03/2024).  Coding for today's visit was reached by Medical Decision Making (MDM).  Kaitlan Mountain Clower-Money, [place]

## 2024-01-20 NOTE — Progress Notes (Signed)
 Physical Therapy Visit - Daily Note   Payor: BCBS / Plan: BLUE LOCAL WITH [facility] INDIVIDUAL / Product Type: POS /   Visit Count: 2  Rehabilitation Precautions/Restrictions:   Precautions/Restrictions Precautions: DD: 03/29/24, anxiety, depression, BPD1 Restrictions: None        Referring Diagnosis: N94.10 (ICD-10-CM) - Dyspareunia in female   SUBJECTIVE Patient Report:   Been feeling a lot of back pain while using belly brace. Doesn't think it is helping much. During sex, a vibrator is most comfortable but after she orgasms, the pain ramps up more and it is hard to participate in penetration.  Change in Status Since Last Visit: No Pain:  Pain Assessment Pain Assessment: 0-10 Patient's Stated Pain Goal: 3  OBJECTIVE  General Observation/Objective Measures:       Internal assessment:   All information regarding the expectations of participation in pelvic floor physical therapy, including but not limited to, the need for internal and/or external pelvic floor assessment, treatment techniques and plan of care were thoroughly discussed. Pt gives informed verbal consent after patient education for the following components of the pelvic floor muscle exam:  Patient confirms understanding that she may bring a third party to any or all physical therapy sessions and can withdraw consent at any point throughout the plan of care.  Patient verbalized understanding of all above information and consent to pelvic floor assessment and treatment at evaluation and all future physical therapy sessions. Pt placed in hooklying and draped appropriately with sheet. Pt had husband as chaperone and denies contraindications.   1st layer assessed only: tension BL (4/10 pain) R>L; decreased w pf lengthening, mod mobility with pf lengthening that improved with practice and decreased pain  - significant APT        Interventions: [id number] ADL skills:  - kines taping for belly lift and training with  partner, provided handout - education for perineal massage/release of muscle tension at first layer (provided handout)  - wand introduction and education  Manual therapy:  - PFM sustained holds and gentle sweeping of first layer   Neuromuscular re-education:  - PF downtraining/lengthening using balloon analogy    Education: Yes, as described in interventions   ASSESSMENT Patient presents to Pelvic PT for 2nd visit and reported increased back and abdominal pain. Patient exhibits significant APT and hypertonic pf muscles. Today's focus was on belly support, posture, pfm release and relaxation. She responded well to tx with decreased tension after sustained holds and pf lengthening. Patient will benefit from continued skilled pelvic PT to improve QOL.     Therapy Diagnosis:     ICD-10-CM   1. 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 Low back pain during pregnancy in third trimester (CMD)  O26.893, M54.50   3. Dyspareunia in female  N94.10   4. Muscular incoordination  R27.8     Progress Towards Goals:   progressing  Goals Addressed             This Visit's Progress   . Pelvic PT       Short Term Goals (STG) - Time Frame 4 visits  STG 1: Pt will be independent with HEP at least 80% of the time to address functional impairments 01/06/24: Baseline   STG 2: Pt will demonstrate appropriate breathing mechanics to improve pelvic floor tension and reduce poor IAP 01/06/24: Baseline; breath holding   STG 3: Pt will demonstrate improved standing and seated postures to reduce LBP and promote pelvic stability during pregnancy 01/06/24: Baseline;  rounded shoulders, APT  Long Term Goals (LTG) - Time Frame 8 visits  LTG 1: Pt will improve PGQ by 20 points to indicate improvements to pelvic floor function and improve QoL 01/06/24: Baseline; 57/75 points   LTG 2: Pt will demonstrate pelvic floor muscle coordination WFL to manage IAP, promote full ROM, and promote ease of delivery 01/06/24: Baseline;  breath holding   LTG 3: Pt will demonstrate appropriate labor and delivery positions with minimal cueing to aid in ease of independent labor 01/06/24: Baseline; not yet educated   LTG 4: Pt will report decrease in vaginal pain with intercourse >/=25% in order to engage in sexual activity and intimacy with partner without discomfort 01/06/24: Baseline         PLAN Treatment Frequency and Duration:  PT Frequency: 1x/week; Every other week PT Duration: Other (comment) (8 visits)  Recommended PT Treatment/Interventions: Aquatic therapy (02886); Neuromuscular re-education [phone]); Manual therapy (97140); Self-care/home management [phone]); Therapeutic activity (97530); Therapeutic exercise (97110); Therapeutic massage [phone])   Recommended Consults:  None currently  Development of Plan of Care:  No change in POC.  Total Treatment Time (Time & Untimed): Total Treatment minutes: 50 Total Time in Timed Codes: Timed Code Minutes: 50     PT Treatment/Procedure Neuromuscular Re-education minutes: 15 Self-Care/Home Mgmt Training minutes: 20 Manual Therapy Minutes: 15            The patient has been instructed to contact our clinic if any questions or problems should arise.

## 2024-02-03 NOTE — Progress Notes (Signed)
 RETURN PRENATAL VISIT Chief Complaint: Jean Stone is a 23 y.o. G2P0010 at [redacted]w[redacted]d with an EDD of 03/29/2024, Date entered prior to episode creation here for a routine return prenatal visit.   She has no complaints. She denies vaginal bleeding, leakage of fluid, or contractions.  Reports normal fetal movement.  She denies shortness of breath, chest pain, worsening peripheral edema, nausea/vomiting, right upper quadrant abdominal pain, blurry or spotty vision or severe or persistent headache  Exam: BP 122/71   Pulse 84   Wt 136 kg (299 lb 3.2 oz)   LMP 06/23/2023   BMI 48.29 kg/m  See OB Tools  Assessment/Plan: 23 y.o. G2P0010 at [redacted]w[redacted]d here for a return prenatal visit. See problem list overviews for comprehensive pregnancy plans for each problem. The following were addressed today:  Obesity, Class III, BMI 40-49.9 (morbid obesity) BPP scheduled for 34 weeks and weekly to follow.  Pelvic floor tension Continues to reports pelvic pressure and discomfort. Worse when working. She is no longer able to complete the tasks of her position and will be taking leave this week.    Follow up: Return in about 2 weeks (around 02/17/2024).  Coding for today's visit was reached by Medical Decision Making (MDM).  Kaitlan Mountain Clower-Money, [place]

## 2024-02-16 NOTE — Progress Notes (Signed)
 Physical Therapy Visit - Daily Note   Payor: [employer] / Plan: BLUE LOCAL WITH [facility] INDIVIDUAL / Product Type: POS /   Visit Count: 3   Rehabilitation Precautions/Restrictions:   Precautions/Restrictions Precautions: DD: 03/29/24, anxiety, depression, BPD1 Restrictions: None        Referring Diagnosis: N94.10 (ICD-10-CM) - Dyspareunia in female   SUBJECTIVE Patient Report:   Been doing breathing exercises but not perineal massage because when partner did it hurt a lot. Been feeling a lot of pain, walked a lot at baby shower and that night was in so much pain when trying to go to bathroom from belly button down.  Wanting vaginal birth without epidural. Wants to focus on birth information.   Change in Status Since Last Visit: No Pain:  Pain Assessment Pain Assessment: No/denies pain  OBJECTIVE  General Observation/Objective Measures:         Reduced er/ir hip rom in birth positions        Interventions:  ADL skills:  - birth education for different stages of labor positioning  - pain management tools for labor (tens, comb, counterpressure for pelvic inlet/outlet opening, sacral pressure, movement/jiggle, etc.) - educated on open glottis pushing (not practiced) - education holding off on kegels   Therapeutic activity:  - ER and IR positions with her and partner (active and passive with partner support) on ball, standing, SL, on back with pillow off weighting tailbone   Education: Yes, as described in interventions   ASSESSMENT Patient presents to Pelvic PT for 3rd visit and reports increased pain in back/hips when prolonged walking/standing. Patient exhibits significant tightness into hip IR/ER in different birth positions. Today's focus was on birth preparation education, positions, and pain management tools. Patient will benefit from continued skilled pelvic PT to decrease sx, prepare for birth, and Improve QOL.     Therapy Diagnosis:     ICD-10-CM   1.  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 Low back pain during pregnancy in third trimester (CMD)  O26.893, M54.50   3. Dyspareunia in female  N94.10   4. Muscular incoordination  R27.8     Progress Towards Goals:   progressing  Goals Addressed             This Visit's Progress   . Pelvic PT       Short Term Goals (STG) - Time Frame 4 visits  STG 1: Pt will be independent with HEP at least 80% of the time to address functional impairments 01/06/24: Baseline   STG 2: Pt will demonstrate appropriate breathing mechanics to improve pelvic floor tension and reduce poor IAP 01/06/24: Baseline; breath holding   STG 3: Pt will demonstrate improved standing and seated postures to reduce LBP and promote pelvic stability during pregnancy 01/06/24: Baseline; rounded shoulders, APT  Long Term Goals (LTG) - Time Frame 8 visits  LTG 1: Pt will improve PGQ by 20 points to indicate improvements to pelvic floor function and improve QoL 01/06/24: Baseline; 57/75 points   LTG 2: Pt will demonstrate pelvic floor muscle coordination WFL to manage IAP, promote full ROM, and promote ease of delivery 01/06/24: Baseline; breath holding   LTG 3: Pt will demonstrate appropriate labor and delivery positions with minimal cueing to aid in ease of independent labor 01/06/24: Baseline; not yet educated   LTG 4: Pt will report decrease in vaginal pain with intercourse >/=25% in order to engage in sexual activity and intimacy with partner without discomfort 01/06/24: Baseline  PLAN Treatment Frequency and Duration:  PT Frequency: 1x/week; Every other week PT Duration: Other (comment) (8 visits)  Recommended PT Treatment/Interventions: Aquatic therapy (02886); Neuromuscular re-education [phone]); Manual therapy (97140); Self-care/home management [phone]); Therapeutic activity (97530); Therapeutic exercise (97110); Therapeutic massage [phone])   Recommended Consults:  None currently  Development of Plan of Care:  No  change in POC.  Total Treatment Time (Time & Untimed): Total Treatment minutes: 43 Total Time in Timed Codes: Timed Code Minutes: 43     PT Treatment/Procedure Therapeutic Activities minutes: 20 Self-Care/Home Mgmt Training minutes: 23            The patient has been instructed to contact our clinic if any questions or problems should arise.

## 2024-03-08 NOTE — Progress Notes (Signed)
 RETURN PRENATAL VISIT Chief Complaint: Jean Stone is a 23 y.o. G2P0010 at [redacted]w[redacted]d with an EDD of 03/29/2024, Date entered prior to episode creation here for a routine prenatal visit.   She denies vaginal bleeding, loss of fluid, or contractions and reports normal fetal movement for gestational age. Pt c/o none. Nervous for birth.   Exam: BP 126/78   Pulse 77   Temp 97.1 F (36.2 C)   Wt (!) 143 kg (314 lb 6.4 oz)   LMP 06/23/2023   SpO2 99%   BMI 50.75 kg/m  See prenatal flowsheet  Cephalic by BSUS.   Assessment/Plan: 23 y.o. G2P0010 at [redacted]w[redacted]d here for a prenatal visit. See problem list overviews for comprehensive pregnancy plans for each problem. The following were addressed today:  [redacted] weeks gestation of pregnancy (CMD) GBS today. Reviewed birth prep and when to present to L&D.    Follow up:  Scheduled Future Appointments       Provider Department Dept Phone Center   03/15/2024 10:15 AM SHEP 1 US  ROOM [facility] - MATERNAL FETAL MEDICINE [phone]    03/18/2024 11:30 AM Tawni Fuller [place]   03/22/2024 10:15 AM SHEP 3 US  ROOM [facility] - MATERNAL FETAL MEDICINE [phone]          Coding for today's visit was reached by Medical Decision Making (MDM).  Laymon JINNY Redman, CNM

## 2024-03-11 LAB — OB RESULTS CONSOLE GBS: GBS: NEGATIVE

## 2024-03-16 NOTE — Telephone Encounter (Signed)
 Escalation call received from access center. Patient is verified by name and and date of birth. She is currently 38.[redacted] weeks gestation.   LABOR/PRETERM LABOR    HISTORY When did the symptoms begin? Today at 10 am started having swelling in hands and feet, with some tingling in her hands.  2. Describe the contractions that you are having (e.g., duration, frequency, regularity, severity) denies pain or tightening in abdomen, history of intermittent back pain, rates 5/10 currently. Tylenol  doesn't help.  3. What date are you expecting to deliver? 03/29/2024 4. Have you had a baby before? no If yes, How long did the labor last? N/a Have you ever had a baby early (>3  week before your due date)? N/a 5. Has the baby's movement decreased or changed significantly from normal? no 6. Do you have any other symptoms? (e.g., leaking fluid from vagina, fever, hand/facial swelling) some blurry vision earlier today, happens when she gets hot. Denies headache, pain under right breast, vaginal bleeding, watery discharge from vagina, fever, nausea, or vomiting. Patient states has been tested for elevated blood pressure during pregnancy, but all was normal. She states swelling started around 10 am and is worsening. Patient advised to have someone take her to the birthing center for evaluation of swelling and visual changes. She is reviewed reasons to call 911. Questions answered, patient verbalizes understanding of all.       CALL EMS FOR: Constant SEVERE abdominal pain Patient is passed out Patient is difficult to awaken or acting confused Severe bleeding Umbilical cord hanging out of the vagina Uncontrollable urge to push Can see the baby If situation sounds like a life threatening emergency   GO TO BIRTH CENTER NOW IF: Contractions <10 minutes apart for 1 hour or more Contractions >10 minutes AND persistent for >24 hours AND no improvement with care advice Contractions AND vaginal bleeding Vaginal  bleeding (Exception: brief spotting after intercourse or pelvic exam, or slight pinkish or brownish mucous discharge)  Leakage of fluid from vagina Baby is moving less Severe headache or headache not improved with Tylenol  New blurred vision or vision changes Fever >100.4 Increased pressure in pelvic area despite rest/hydration If patient has a cerclage   REASSURANCE AND EDUCATION  CONTRACTIONS Drink 2 glasses of water  Lay down on your left side and rest. Count the contractions for a one hour period. If you continue to have contractions every 10 minutes or more frequently, then you need to be examined immediately.     FETAL MOVEMENT AND PREGNANCY DATES: 1-15 Weeks: Baby is too small for mother to feel the baby move.  16-18 Weeks: Some women begin to feel the baby move, especially if they had a baby before.  18-20 Weeks: Many women begin to feel baby move around this time 20-23 Weeks: Most women begin to feel baby move around this time.  24 Weeks: All women should feel the baby move by this time.  Over 28 Weeks: Some doctors advise that women check kick counts each day  KICK COUNT INSTRUCTIONS:  Pick the time of the day that your baby is most active.  Sit back in a comfortable chair or lay down on your left side in bed.  Do this in a quiet room (no TV, cell phone, computer, or children).  Count any baby movement (kicks, rolls, flutters). Count up to 10.  NORMAL KICK COUNT: 5 or more in one hour or 10 or more in 2 hours.  LOW KICK COUNT: Less than 5 in  one hour or less than 10 in 2 hour   SPOTTING AFTER INTERCOURSE OR PELVIC EXAM Slight spotting from pregnant cervix can occur after intercourse or a pelvic exam  The cervix (opening to the womb) in a pregnant woman is very vascular. Sexual intercourse or an internal exam can cause some spotting or a small amount of pinkish or brownish mucus discharge. This should go away within 24 hours   CALL BACK IF: Spotting lasts longer than  24 hours  Any bright red vaginal bleeding occurs  Leakage of fluid occurs (i.e., rupture of membranes)  You notice a significant decrease in your baby's movement  You become worse    Rock Samie Stacks, RN

## 2024-03-23 ENCOUNTER — Inpatient Hospital Stay (HOSPITAL_COMMUNITY)
Admission: AD | Admit: 2024-03-23 | Discharge: 2024-03-23 | Discharge status: Against Medical Advice | Attending: Obstetrics and Gynecology | Admitting: Obstetrics and Gynecology

## 2024-03-23 ENCOUNTER — Other Ambulatory Visit: Payer: Self-pay

## 2024-03-23 ENCOUNTER — Encounter (HOSPITAL_COMMUNITY): Payer: Self-pay | Admitting: Obstetrics and Gynecology

## 2024-03-23 DIAGNOSIS — Z3A39 39 weeks gestation of pregnancy: Secondary | ICD-10-CM

## 2024-03-23 DIAGNOSIS — O368130 Decreased fetal movements, third trimester, not applicable or unspecified: Secondary | ICD-10-CM | POA: Diagnosis not present

## 2024-03-23 DIAGNOSIS — O133 Gestational [pregnancy-induced] hypertension without significant proteinuria, third trimester: Secondary | ICD-10-CM

## 2024-03-23 DIAGNOSIS — Z5329 Procedure and treatment not carried out because of patient's decision for other reasons: Secondary | ICD-10-CM | POA: Diagnosis not present

## 2024-03-23 LAB — COMPREHENSIVE METABOLIC PANEL WITH GFR
ALT: 16 U/L (ref 0–44)
AST: 21 U/L (ref 15–41)
Albumin: 2.4 g/dL — ABNORMAL LOW (ref 3.5–5.0)
Alkaline Phosphatase: 203 U/L — ABNORMAL HIGH (ref 38–126)
Anion gap: 10 (ref 5–15)
BUN: 11 mg/dL (ref 6–20)
CO2: 20 mmol/L — ABNORMAL LOW (ref 22–32)
Calcium: 9.3 mg/dL (ref 8.9–10.3)
Chloride: 106 mmol/L (ref 98–111)
Creatinine, Ser: 0.65 mg/dL (ref 0.44–1.00)
GFR, Estimated: >60 mL/min (ref >60)
Glucose, Bld: 125 mg/dL — ABNORMAL HIGH (ref 70–99)
Potassium: 4 mmol/L (ref 3.5–5.1)
Sodium: 136 mmol/L (ref 135–145)
Total Bilirubin: 0.3 mg/dL (ref 0.0–1.2)
Total Protein: 6.3 g/dL — ABNORMAL LOW (ref 6.5–8.1)

## 2024-03-23 LAB — URINALYSIS, ROUTINE W REFLEX MICROSCOPIC
Bilirubin Urine: NEGATIVE
Glucose, UA: NEGATIVE mg/dL
Hgb urine dipstick: NEGATIVE
Ketones, ur: NEGATIVE mg/dL
Nitrite: NEGATIVE
Protein, ur: 30 mg/dL — AB
Specific Gravity, Urine: 1.026 (ref 1.005–1.030)
pH: 5 (ref 5.0–8.0)

## 2024-03-23 LAB — CBC
HCT: 40.6 % (ref 36.0–46.0)
Hemoglobin: 13.1 g/dL (ref 12.0–15.0)
MCH: 26.5 pg (ref 26.0–34.0)
MCHC: 32.3 g/dL (ref 30.0–36.0)
MCV: 82 fL (ref 80.0–100.0)
Platelets: 305 K/uL (ref 150–400)
RBC: 4.95 MIL/uL (ref 3.87–5.11)
RDW: 14.7 % (ref 11.5–15.5)
WBC: 10.7 K/uL — ABNORMAL HIGH (ref 4.0–10.5)
nRBC: 0 % (ref 0.0–0.2)

## 2024-03-23 NOTE — MAU Provider Note (Signed)
 History     [id number]  Arrival date and time: 03/23/24 1954    Chief Complaint  Patient presents with   Decreased Fetal Movement     HPI Jean Stone is a 23 y.o. at [redacted]w[redacted]d who presents for decreased fetal movement. She states that she has felt baby move but it has been different from previous. It started this morning. She ate cold watermelon and drank sugary drinks to try and increase movement. She did not attempt fetal kick counts. All prenatal care is at [facility]. States that she has had no issues with the pregnancy.       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 No pertinent surgical history.  Family History  Problem Relation Age of Onset   Hyperlipidemia Mother    Cancer Father    Depression Father    ADD / ADHD Sister    Diabetes Maternal Grandmother    Hypertension Maternal Grandfather     Social History   Socioeconomic History   Marital status: Single    Spouse name: Not on file   Number of children: Not on file   Years of education: Not on file   Highest education level: Not on file  Occupational History   Occupation: [employer]  Tobacco Use   Smoking status: Never   Smokeless tobacco: Never  Vaping Use   Vaping status: Never Used  Substance and Sexual Activity   Alcohol use: No    Alcohol/week: 0.0 standard drinks of alcohol   Drug use: No   Sexual activity: Not Currently  Other Topics Concern   Not on file  Social History Narrative   Work or School: [employer] Situation: lives with mother, sister and brother      Spiritual Beliefs: Baha'i       Lifestyle: no regular exercise, does PE at school; diet is healthy         Social Drivers of Corporate investment banker Strain: Not on file  Food Insecurity: Not on file  Transportation Needs: Not on file  Physical Activity: Not on file  Stress: Not on file  Social Connections: Not on file  Intimate Partner Violence: Not on file    No Active Allergies  No current facility-administered medications on file prior to encounter.   Current Outpatient Medications on File Prior to Encounter  Medication Sig Dispense Refill   escitalopram (LEXAPRO) 10 MG tablet Take 10 mg by mouth daily.     Prenatal Vit-Fe Fumarate-FA (MULTIVITAMIN-PRENATAL) 27-0.8 MG TABS tablet Take 1 tablet by mouth daily at 12 noon.      Pertinent positives and negative per HPI, all others reviewed and negative  Physical Exam   BP (!) 154/102   Pulse 94   Temp 98.5 F (36.9 C) (Oral)   Resp 18   Ht 5' 5 (1.651 m)   Wt (!) 145.1 kg   SpO2 99%   BMI 53.23 kg/m   Patient Vitals for the past 24 hrs:  BP Temp Temp  src Pulse Resp SpO2 Height Weight  03/23/24 2131 (!) 154/102 -- -- 94 -- -- -- --  03/23/24 2130 -- -- -- -- -- 99 % -- --  03/23/24 2115 (!) 158/105 -- -- 96 -- 99 % -- --  03/23/24 2100 (!) 164/110 -- -- 97 -- 98 % -- --  03/23/24 2045 (!) 151/102 -- -- 95 -- 99 % -- --  03/23/24 2034 (!) 146/101 -- -- 97 -- 98 % -- --  03/23/24 2011 (!) 143/86 98.5 F (36.9 C) Oral (!) 107 18 98 % 5' 5 (1.651 m) (!) 145.1 kg    Physical Exam Vitals and nursing note reviewed.  Constitutional:      Appearance: She is well-developed.  HENT:     Head: Normocephalic and atraumatic.     Mouth/Throat:     Mouth: Mucous membranes are moist.  Eyes:     Extraocular Movements: Extraocular movements intact.  Cardiovascular:     Rate and Rhythm: Normal rate and regular rhythm.  Pulmonary:     Effort: Pulmonary effort is normal.  Abdominal:     Palpations: Abdomen is soft.     Tenderness: There is no  abdominal tenderness.  Skin:    Capillary Refill: Capillary refill takes less than 2 seconds.  Neurological:     General: No focal deficit present.     Mental Status: She is alert.      FHT Baseline: 135 bpm Variability: Good {> 6 bpm) Accelerations: Reactive Decelerations: Absent Uterine activity: None   Labs Results for orders placed or performed during the hospital encounter of 03/23/24 (from the past 24 hours)  Urinalysis, Routine w reflex microscopic -Urine, Clean Catch     Status: Abnormal   Collection Time: 03/23/24  8:15 PM  Result Value Ref Range   Color, Urine AMBER (A) YELLOW   APPearance CLOUDY (A) CLEAR   Specific Gravity, Urine 1.026 1.005 - 1.030   pH 5.0 5.0 - 8.0   Glucose, UA NEGATIVE NEGATIVE mg/dL   Hgb urine dipstick NEGATIVE NEGATIVE   Bilirubin Urine NEGATIVE NEGATIVE   Ketones, ur NEGATIVE NEGATIVE mg/dL   Protein, ur 30 (A) NEGATIVE mg/dL   Nitrite NEGATIVE NEGATIVE   Leukocytes,Ua MODERATE (A) NEGATIVE   RBC / HPF 0-5 0 - 5 RBC/hpf   WBC, UA 21-50 0 - 5 WBC/hpf   Bacteria, UA MANY (A) NONE SEEN   Squamous Epithelial / HPF 11-20 0 - 5 /HPF   Mucus PRESENT   CBC     Status: Abnormal   Collection Time: 03/23/24  9:15 PM  Result Value Ref Range   WBC 10.7 (H) 4.0 - 10.5 K/uL   RBC 4.95 3.87 - 5.11 MIL/uL   Hemoglobin 13.1 12.0 - 15.0 g/dL   HCT 59.3 63.9 - 53.9 %   MCV 82.0 80.0 - 100.0 fL   MCH 26.5 26.0 - 34.0 pg   MCHC 32.3 30.0 - 36.0 g/dL   RDW 85.2 88.4 - 84.4 %   Platelets 305 150 - 400 K/uL   nRBC 0.0 0.0 - 0.2 %    Imaging No results found.  MAU Course  Procedures Lab Orders         OB RESULT CONSOLE Group B Strep         Urinalysis, Routine w reflex microscopic -Urine, Clean Catch         CBC         Comprehensive metabolic panel  Protein / creatinine ratio, urine    No orders of the defined types were placed in this encounter.  Imaging Orders  No imaging studies ordered today    MDM Moderate (Level  3-4)  Assessment and Plan  #DFM #[redacted] weeks gestation of pregnancy #new gHTN with possible PEC.   #FWB  NST: Reactive  Tomorrow Dehaas is a 23 y.o. at [redacted]w[redacted]d who presents for decreased fetal movement.   -All care at [facility] -Found to have elevated BP's with one severe range while here. PIH labs obtained. -Possible diagnosis of PEC but labs pending and patient left prior to completing work up. -Recommended admission for induction due to new onset gHTN vs PEC.  -After consulting with doula, patient and patient's partner decide to leave AMA so that they can go to [facility].   -Patient left AMA -Long conversation regarding risks of leaving including but not limited to seizure, stroke, maternal and fetal complications discussed in detail with the patient. Advised patient not to drive herself and to head straight to [facility] with no stops along the way.  -All questions answered, anticipatory guidance and detailed PEC/gHTN precautions provided.     Jerome Viglione L Donell Sliwinski, MD/MHA 03/23/24 9:56 PM  Allergies as of 03/23/2024   No Active Allergies      Medication List     STOP taking these medications    lamoTRIgine 100 MG tablet Commonly known as: LAMICTAL   levonorgestrel-ethinyl estradiol  0.15-0.03 MG tablet Commonly known as: SEASONALE   Vitamin D  (Ergocalciferol ) 1.25 MG (50000 UNIT) Caps capsule Commonly known as: DRISDOL    Vyvanse  20 MG capsule Generic drug: lisdexamfetamine       TAKE these medications    escitalopram 10 MG tablet Commonly known as: LEXAPRO Take 10 mg by mouth daily.   multivitamin-prenatal 27-0.8 MG Tabs tablet Take 1 tablet by mouth daily at 12 noon.

## 2024-03-23 NOTE — MAU Note (Signed)
 Patient and husband request to leave AMA. Provider discussed risks of leaving. Patient's husband signed the AMA form for her, neither the patient or the husband had any questions.

## 2024-03-23 NOTE — MAU Note (Signed)
 Jean Stone is a 23 y.o. at [redacted]w[redacted]d here in MAU reporting: no FM today at all. States her abdomen has felt tight all day it doesn't go away. Reports some mucous discharge mixed with some blood but denies significant VB or LOF.   LMP: NA Onset of complaint: all day  Pain score: 4 Vitals:   03/23/24 2011  BP: (!) 143/86  Pulse: (!) 107  Resp: 18  Temp: 98.5 F (36.9 C)  SpO2: 98%     FHT: 133  Lab orders placed from triage: UA
